# Patient Record
Sex: Male | Born: 1955 | Race: White | Hispanic: No | Smoking: Never smoker
Health system: Southern US, Community
[De-identification: ages and names within clinical notes are randomized; demographics above are authoritative.]

## PROBLEM LIST (undated history)

## (undated) DIAGNOSIS — I2510 Atherosclerotic heart disease of native coronary artery without angina pectoris: Secondary | ICD-10-CM

## (undated) DIAGNOSIS — I739 Peripheral vascular disease, unspecified: Secondary | ICD-10-CM

## (undated) DIAGNOSIS — Z8673 Personal history of transient ischemic attack (TIA), and cerebral infarction without residual deficits: Secondary | ICD-10-CM

## (undated) DIAGNOSIS — I10 Essential (primary) hypertension: Secondary | ICD-10-CM

## (undated) DIAGNOSIS — F32A Depression, unspecified: Secondary | ICD-10-CM

## (undated) DIAGNOSIS — F329 Major depressive disorder, single episode, unspecified: Secondary | ICD-10-CM

## (undated) DIAGNOSIS — G4733 Obstructive sleep apnea (adult) (pediatric): Secondary | ICD-10-CM

## (undated) DIAGNOSIS — I779 Disorder of arteries and arterioles, unspecified: Secondary | ICD-10-CM

## (undated) DIAGNOSIS — E782 Mixed hyperlipidemia: Secondary | ICD-10-CM

## (undated) HISTORY — PX: CORONARY ARTERY BYPASS GRAFT: SHX141

## (undated) HISTORY — DX: Essential (primary) hypertension: I10

## (undated) HISTORY — DX: Major depressive disorder, single episode, unspecified: F32.9

## (undated) HISTORY — DX: Depression, unspecified: F32.A

## (undated) HISTORY — DX: Peripheral vascular disease, unspecified: I73.9

## (undated) HISTORY — DX: Personal history of transient ischemic attack (TIA), and cerebral infarction without residual deficits: Z86.73

## (undated) HISTORY — PX: OTHER SURGICAL HISTORY: SHX169

## (undated) HISTORY — DX: Obstructive sleep apnea (adult) (pediatric): G47.33

## (undated) HISTORY — DX: Atherosclerotic heart disease of native coronary artery without angina pectoris: I25.10

## (undated) HISTORY — DX: Disorder of arteries and arterioles, unspecified: I77.9

## (undated) HISTORY — PX: BACK SURGERY: SHX140

## (undated) HISTORY — DX: Mixed hyperlipidemia: E78.2

## (undated) HISTORY — PX: KNEE SURGERY: SHX244

---

## 1999-01-18 ENCOUNTER — Inpatient Hospital Stay (HOSPITAL_COMMUNITY): Admission: AD | Admit: 1999-01-18 | Discharge: 1999-01-30 | Payer: Self-pay | Admitting: Cardiovascular Disease

## 1999-01-24 ENCOUNTER — Encounter: Payer: Self-pay | Admitting: Cardiovascular Disease

## 1999-01-25 ENCOUNTER — Encounter: Payer: Self-pay | Admitting: Cardiovascular Disease

## 1999-01-25 ENCOUNTER — Encounter: Payer: Self-pay | Admitting: Cardiothoracic Surgery

## 1999-01-26 ENCOUNTER — Encounter: Payer: Self-pay | Admitting: Cardiothoracic Surgery

## 1999-01-27 ENCOUNTER — Encounter: Payer: Self-pay | Admitting: Cardiothoracic Surgery

## 1999-01-28 ENCOUNTER — Encounter: Payer: Self-pay | Admitting: Cardiothoracic Surgery

## 2004-06-30 ENCOUNTER — Inpatient Hospital Stay (HOSPITAL_COMMUNITY): Admission: AD | Admit: 2004-06-30 | Discharge: 2004-07-03 | Payer: Self-pay | Admitting: Cardiology

## 2006-05-17 IMAGING — CR DG CHEST 1V PORT
1 series · 1 of 1 positions shown · non-contrast
Comparison: none

CLINICAL DATA: Chest pain.  
 PORTABLE CHEST - 1 VIEW 07/01/04 AT 3833 HOURS:

[view not recorded]
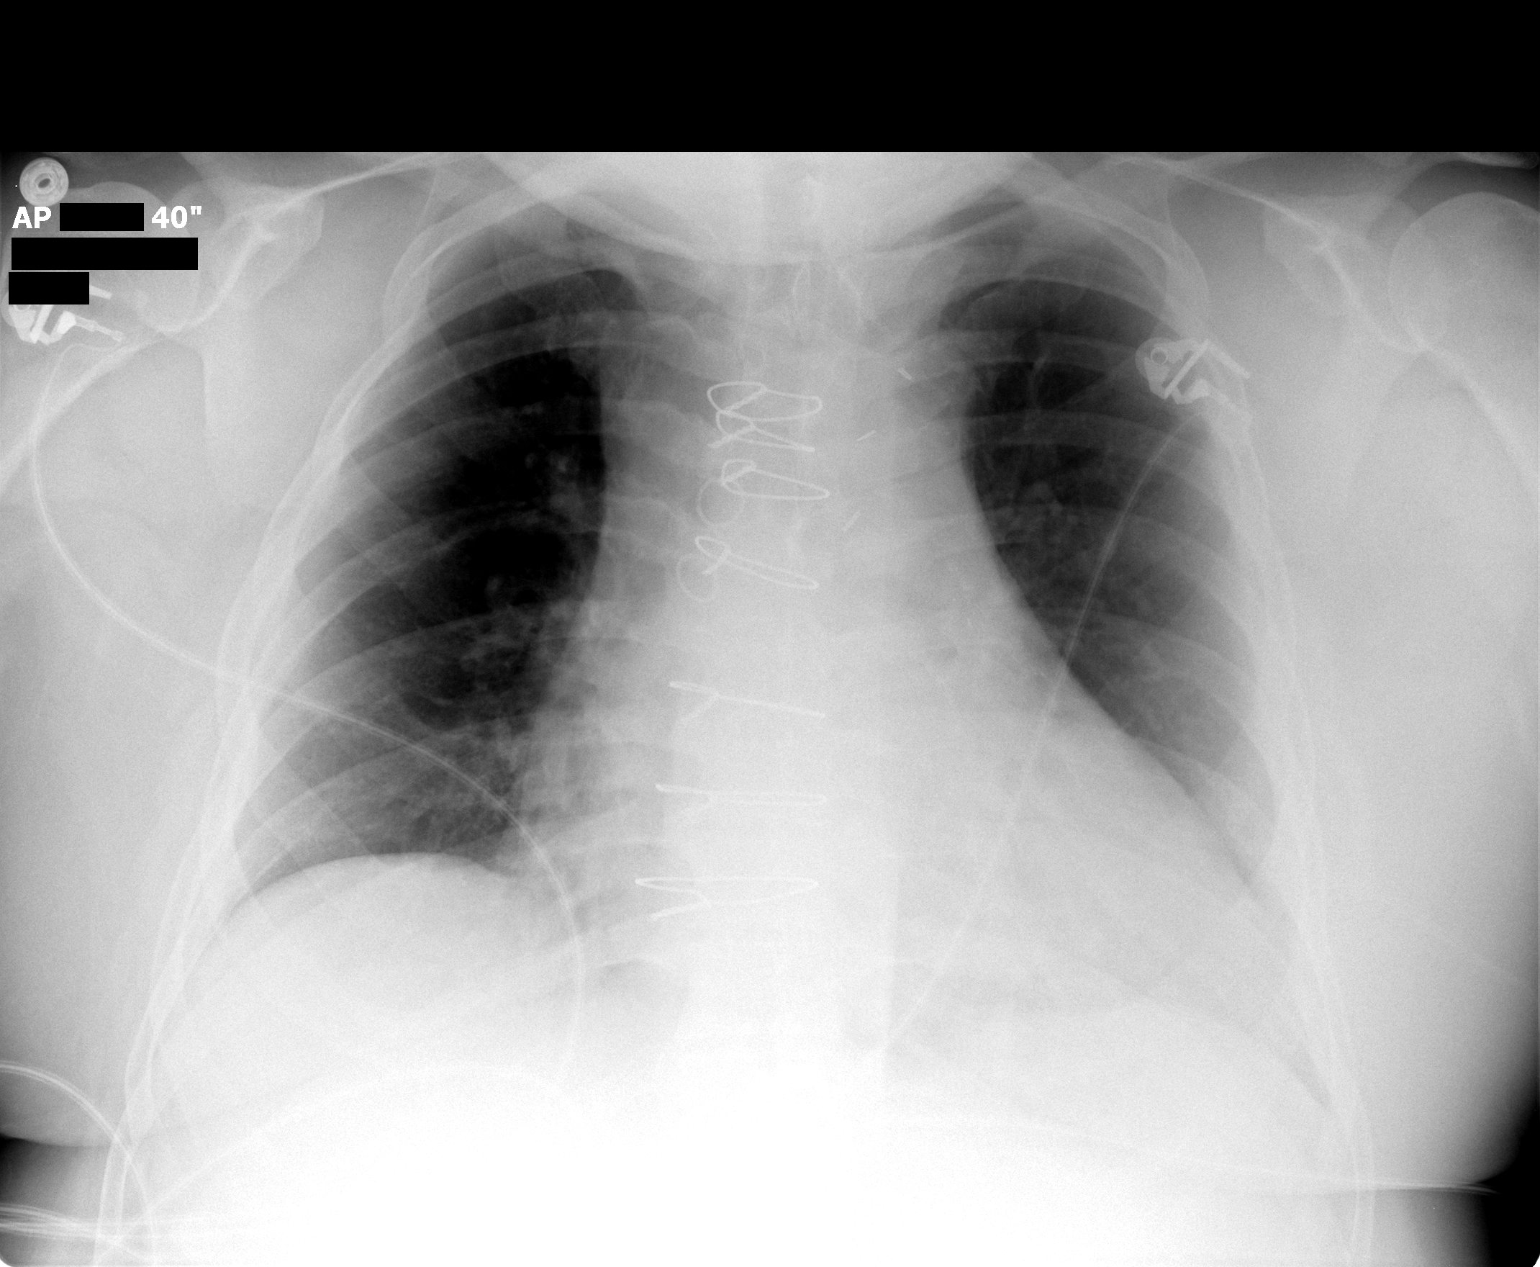

[1 of 1 positions shown; findings below may reference images not displayed]

FINDINGS: The heart is moderately enlarged.  The lungs are clear.  The pulmonary vasculature is within normal limits.  No pneumothoraces or effusions.
IMPRESSION: Cardiomegaly without CHF.

## 2006-06-27 ENCOUNTER — Ambulatory Visit: Payer: Self-pay | Admitting: *Deleted

## 2006-09-06 ENCOUNTER — Ambulatory Visit: Payer: Self-pay | Admitting: *Deleted

## 2006-09-06 ENCOUNTER — Inpatient Hospital Stay (HOSPITAL_COMMUNITY): Admission: RE | Admit: 2006-09-06 | Discharge: 2006-09-08 | Payer: Self-pay | Admitting: *Deleted

## 2006-09-06 ENCOUNTER — Encounter (INDEPENDENT_AMBULATORY_CARE_PROVIDER_SITE_OTHER): Payer: Self-pay | Admitting: *Deleted

## 2006-10-24 ENCOUNTER — Ambulatory Visit: Payer: Self-pay | Admitting: *Deleted

## 2008-07-22 IMAGING — CR DG CHEST 1V PORT
1 series · 1 of 1 positions shown · non-contrast
Comparison: 09/04/06.

CLINICAL DATA: 51 year old with chest pain and shortness of breath.  
 PORTABLE CHEST - 1 VIEW - 09/06/06:

[view not recorded]
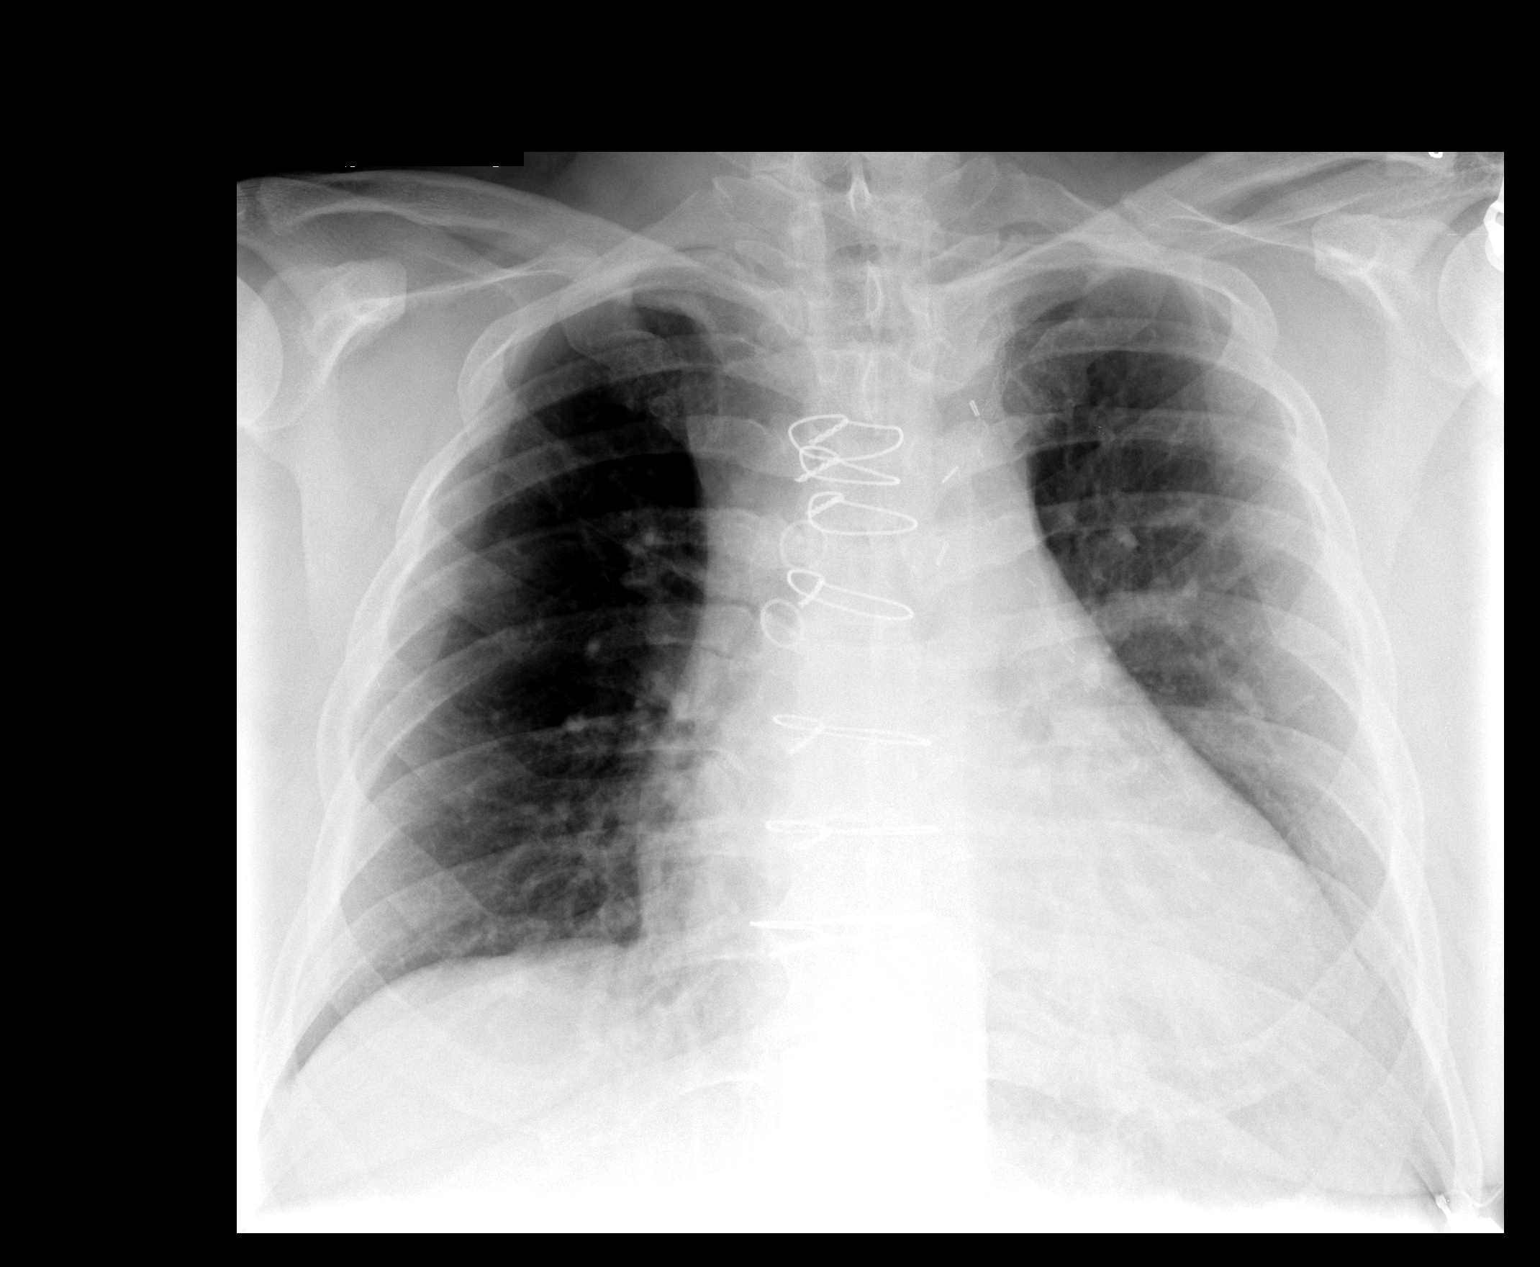

[1 of 1 positions shown; findings below may reference images not displayed]

FINDINGS: Stable surgical changes related to bypass surgery.  The heart is enlarged.  There are slightly low lung volumes with mild vascular crowding.  No edema, infiltrates, or effusions.  No pneumothorax.
IMPRESSION: Cardiac enlargement and chronic lung changes.  No definite acute pulmonary findings.

## 2010-05-23 NOTE — Discharge Summary (Signed)
[name], Wilkinson             ACCOUNT NO.:  [id number]   MEDICAL RECORD NO.:  [id number]          PATIENT TYPE:  INP   LOCATION:  2040                         FACILITY:  [facility]   PHYSICIAN:  [name], M.D.    DATE OF BIRTH:  1955-03-18   DATE OF ADMISSION:  09/06/2006  DATE OF DISCHARGE:  09/08/2006                               DISCHARGE SUMMARY   Please see discharge summary job [phone] for details regarding patient's  admission diagnoses, hospital course, discharge medications and follow  up.   ADMISSION DIAGNOSIS:  Right leg claudication.   DISCHARGE AND SECONDARY DIAGNOSES:  1. Right lower extremity claudication, status post right femoral      endarterectomy.  2. Postoperative unstable angina with workup negative for myocardial      infarction, now improved with a chronic level 4 angina.  3. History of coronary artery disease, status post previous coronary      artery bypass grafting in 2001.  4. Hyperlipidemia.  5. Extracranial cerebrovascular occlusive disease, status post left      carotid endarterectomy, followed by left carotid stenting.  6. Severe sleep apnea, use of CPAP.   ALLERGIES:  No drug allergies.   HISTORY OF PRESENT ILLNESS:  Alexander Wilkinson is a 55 year old Caucasian  male with a history of known atherosclerotic vascular disease.  He had  previously undergone redo coronary artery bypass grafting by Dr.  Tyrone Sage in 2001.  He had also had left carotid endarterectomy with  subsequent with early re-stenosis and had a left carotid stent placed in  December 2005 at [facility].  In February this year, he  suffered an episode of syncope and was found to have a reduced ejection  fraction and a LIMA placed.  Since the catheterization procedure, he has  suffered from right lower extremity claudication.  He notes pain in his  right calf with ambulation, which is relieved readily by rest.  He had a  CT angiogram carried out at [facility] and  revealed a focal  dissection at the level of the right common femoral artery.  Lower  extremity Doppler evaluation revealed an ankle brachial index of 1.14 on  the right.  However, drop in pressure in his right lower extremity with  exercise associated with onset of claudication.  However, there is a  drop in pressure in his right lower extremity with exercise associated  with onset of claudication in his right calf.  He was referred to  vascular surgery, Dr. Denman George, who recommended right femoral  artery repair of apparent regional dissection, due to dissection of his  right lower extremity claudication.   HOSPITAL COURSE:  Please see previously dictated discharge summary.  He  as well, however, following this discharge summary dictation, developed  severe chest pain in the recovery unit, which he rated 9/10.  A  cardiology consult was requested and he was seen by [facility] and  Vascular cardiologist, Dr. Jenne Campus.  He was started on nitroglycerin  drip and given a bolus of Plavix 300 mg.  He underwent serial cardiac  enzymes, which were in fact  negative.  He remained on nitroglycerin drip  overnight and the morning round of postoperative day 1, was still having  some chest pain but now at a level of 2/10, which he said 2-3/10 is his  baseline at home.  Subsequently, the nitroglycerin drip was  discontinued.  However, it was felt he should be monitored at least  another night to insure that his chest pain did not intensify,  particularly with activity.  On postoperative day 2, he remained  hemodynamically stable, still with chest pain about 2-3/10, which again  was his baseline.  Other than his initial first time of ambulation, he  had no change in intensity in his chest pain with activity.  Vitals  showed a blood pressure of 109/73.  Heart rate in sinus rhythm promptly  relieved at 60. He was afebrile with one isolated temperature of 99.9  overnight.  Oxygen saturation  was 97% on room air.  He was voiding  without difficulty.  Pain was controlled with oral medication.  His  wound appeared to be healing well without any sign of infection or  hematoma.  He had palpable pedal pulses bilaterally.  Most recent labs  showed a hemoglobin of 11.4, hematocrit 33.2, platelet count of 185.  White blood count was 8.3.  Sodium 136, potassium 4.1, chloride 101, CO2  31, BUN 10, creatinine 1.12, blood glucose 128.  His liver function  tests and cardiac enzymes were normal.  His TSH was also normal at  2.545.  He was ambulating without difficulty as well.   On postoperative day 2, he was reevaluated by cardiology.  They felt he  was stable from a cardiac standpoint for discharge.  He was also felt to  have met criteria for discharge from a vascular standpoint as well.  He  was discharged on evening of postoperative day 2 in stable condition.   DISCHARGE INSTRUCTIONS:  Discharge medications are as previously  dictated.  Discharge instructions are as dictated well, except he was  instructed to follow up with his primary cardiologist, Dr. [name], in 2  weeks or sooner if needed.      [name], P.A.      [name], M.D.  Electronically Signed    AWZ/MEDQ  D:  09/08/2006  T:  09/08/2006  Job:  [id number]   cc:   [name], M.D.  [facility]. [name], M.D.

## 2010-05-23 NOTE — Discharge Summary (Signed)
NAMETAM, DELISLE             ACCOUNT NO.:  [id number]   MEDICAL RECORD NO.:  [id number]          PATIENT TYPE:  INP   LOCATION:  3311                         FACILITY:  [facility]   PHYSICIAN:  [name], M.D.    DATE OF BIRTH:  Jan 14, 1955   DATE OF ADMISSION:  09/06/2006  DATE OF DISCHARGE:  09/07/2006                               DISCHARGE SUMMARY   ADMISSION DIAGNOSES:  1. Claudication, right lower extremity.  2. Coronary artery disease, status post redo coronary artery bypass in      2001.  3. Hyperlipidemia.  4. Extracranial cerebrovascular occlusive disease, status post left      carotid endarterectomy followed by left carotid stenting.   HOSPITAL COURSE:  [name] was admitted to [facility] the morning of September 06, 2006, and taken to the operating room  electively for a right femoral endarterectomy with bovine patch  angioplasty.  This was uneventful procedure.  At termination of the  procedure, he had a 2+ right dorsalis pedis pulse.   The patient was transferred to the Intermediate Care Unit for  observation overnight.  Plan for discharge from hospital on postop day  #1, September 07, 2006, barring complications.   Preoperative assessment revealed chest x-ray with postoperative changes,  heart size was stable.  Lungs were clear and no pleural fluid.  Blood  work revealed no major abnormality.  Random blood sugar was mildly  elevated at 111.  Urinalysis was unremarkable.  Hemoglobin was 13.6,  hematocrit 40.7 with WBC 5.7 and platelet count 227.   DISCHARGE MEDICATIONS:  1. Atenolol 12.5 mg p.o. b.i.d.  2. Plavix 75 mg p.o. daily.  3. Aspirin 81 mg p.o. daily.  4. Imdur 60 mg p.o. daily.  5. Nitroglycerin patch 0.6 once daily.  6. Vytorin 10/80 nightly.  7. Niaspan 1500 mg nightly.  8. Amitriptyline 10 mg nightly.  9. Celexa 10 mg daily.  10.Vitamin B12 1 tablet daily.  11.Vitamin E 1 tablet daily.  12.Omega-3 fish oil 1 tablet daily.  13.Lortab 5/325 1-2 tablets q.4-6 h. p.r.n. as needed.   FOLLOW UP:  The patient will be contacted for office visit in  approximately 1 month.   DISCHARGE DIAGNOSIS:  Right lower extremity claudication.      [name], M.D.  Electronically Signed     PGH/MEDQ  D:  09/06/2006  T:  09/07/2006  Job:  [id number]   cc:   Macarthur Critchley. [name], M.D.

## 2010-05-23 NOTE — Assessment & Plan Note (Signed)
OFFICE VISIT   Alexander Wilkinson, Alexander Wilkinson  DOB:  Jan 12, 1955                                       10/24/2006  NWGNF#:[id number]   The patient is status post right femoral endarterectomy with bovine  patch angioplasty carried out 09/06/2006 at [facility].  Has  had significant improvement in his right lower extremity claudication  symptoms.   Right groin incision healing unremarkably.  2+ right dorsalis pedis  pulse present.  No significant ankle edema.   BP 111/70, pulse 68, respirations 18, O2 saturation 100%.   The patient has done extremely well.  Will enter him in protocol for his  peripheral vascular disease.  He has also had a left carotid  endarterectomy and left carotid stent placed at [facility].  The left carotid  stent was placed in December of 2005.   We will plan to schedule the patient for a carotid surveillance,  especially since he does have a stent in place.  This will be done on a  29-monthly basis.   [name], M.D.  Electronically Signed   PGH/MEDQ  D:  10/24/2006  T:  10/26/2006  Job:  [id number]   cc:   Macarthur Critchley. [name], M.D.

## 2010-05-23 NOTE — H&P (Signed)
Alexander Wilkinson, Alexander Wilkinson             ACCOUNT NO.:  [id number]   MEDICAL RECORD NO.:  [id number]          PATIENT TYPE:  INP   LOCATION:  2899                         FACILITY:  [facility]   PHYSICIAN:  [name], M.D.    DATE OF BIRTH:  11-18-1955   DATE OF ADMISSION:  09/06/2006  DATE OF DISCHARGE:                              HISTORY & PHYSICAL   CARDIOLOGIST:  Macarthur Critchley. [name], M.D.   ADMISSION DIAGNOSIS:  Right lower extremity claudication.   HISTORY:  [name] is a 55 year old gentleman with a history of  known atherosclerotic vascular disease.  He has previously undergone  redo coronary bypass carried out by Dr. Tyrone Sage in 2001.  He has also  had a left carotid endarterectomy with subsequent early restenosis and  had a left carotid stent placed December 2005 at [facility].   He suffered an episode of syncope in February of this year, was found to  have a reduced ejection fraction and a LIMA stent was placed.  Since the  catheterization procedure he has suffered from right lower extremity  claudication.  He notes pain in his right calf with ambulation.  This is  relieved readily by rest.  His CT angiogram carried out in [place]  reveals a focal dissection at the level of the right common femoral  artery.  Lower extremity Doppler evaluation reveals ankle-brachial index  of 1.14 on the right.  There is, however, a drop in pressure in his  right lower extremity with exercise associated with the onset of  claudication in his right calf.   PAST MEDICAL HISTORY:  1. Coronary artery disease status post redo coronary artery bypass      2001.  2. Hyperlipidemia.  3. Extracranial cerebrovascular occlusive disease status post left      carotid endarterectomy followed by left carotid stenting.   MEDICATIONS:  1. Atenolol 12.5 mg p.o. b.i.d.  2. Plavix 75 mg daily.  3. Aspirin 81 mg daily.  4. Imdur 60 mg daily.  5. Nitroglycerin patch 0.6 daily.  6. Vytorin  10/80 one q.h.s.  7. Niaspan 1500 mg one q.h.s.  8. Amitriptyline 10 mg q.h.s.  9. Celexa 10 mg daily.  10.Vitamin B12 one tablet daily.  11.Vitamin E one tablet daily.  12.Omega-3 fatty acid one tablet daily.   ALLERGIES:  None known.   SOCIAL HISTORY:  The patient is married with four children.  He is on  long-term disability.  He does not currently use tobacco or alcohol  products.   REVIEW OF SYSTEMS:  He does suffer from occasional recurrent chronic  chest pain.  He has sleep apnea.  A history of umbilical hernia.  Occasional dizziness.  No recent syncopal episodes.  Describes long-term  depression.  Chronic anemia.   PHYSICAL EXAMINATION:  GENERAL:  Well-appearing 55 year old male.  Alert  and oriented.  No acute distress.  VITAL SIGNS:  BP is 120/70, pulse is 60 per minute, respirations are 18  per minute.  NECK:  Supple without thyromegaly or adenopathy.  CHEST:  Equal air entry bilaterally.  No rales or rhonchi.  CARDIOVASCULAR:  Normal heart sounds without murmurs.  No carotid  bruits.  No femoral bruits.  ABDOMEN:  Soft and nontender.  No masses, organomegaly.  Normal bowel  sounds.  LOWER EXTREMITIES:  No femoral bruits, 1+ right femoral and 2+ left  femoral pulse.  Right popliteal, posterior tibial and dorsalis pedis  pulses are 1+.  Left leg reveals 2+ popliteal, posterior tibial and  dorsalis pedis pulses.  NEUROLOGIC:  The patient is alert and oriented.  Moves all extremities  to command.  Cranial nerves are intact.   IMPRESSION:  1. 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  2. Coronary artery disease.  3. Extracranial cerebrovascular occlusive disease.  4. Hyperlipidemia.   RECOMMENDATION:  The patient admitted to the hospital electively for  right common femoral artery repair due to dissection and symptomatic  right lower extremity claudication.      [name], M.D.  Electronically  Signed     PGH/MEDQ  D:  09/06/2006  T:  09/07/2006  Job:  [id number]   cc:   Macarthur Critchley. [name], M.D.

## 2010-05-23 NOTE — Consult Note (Signed)
VASCULAR SURGERY CONSULTATION   HAGAN, VANAUKEN  DOB:  1955-06-14                                       06/27/2006  BJYNW#:[id number]   REFERRING PHYSICIAN:  Macarthur Critchley. [name], M.D.   REASON FOR CONSULTATION:  Right lower extremity claudication.   HISTORY:  Mr. Selner is a 55 year old gentleman, who has a history of  redo coronary artery bypass carried out by Dr. Tyrone Sage in 2001.  He  also has undergone left carotid endarterectomy, developed early  restenosis and had left carotid stent placed in December 2005 at [facility].   He suffered an episode of syncope in February of this year, was found to  have a reduced ejection fraction and had a LIMA stent placed.  Since  that catheterization procedure, he has suffered from right lower  extremity claudication.  He suffers from pain in his right calf with  ambulation.  This is relieved readily by rest.   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   Lower extremity Doppler evaluation revealed ankle brachial index 1.14 on  the right.  No exercise testing was carried out at that time.   PAST MEDICAL HISTORY:  1. Coronary artery disease status post redo coronary artery bypass in      2001.  2. Hyperlipidemia.  3. Extracranial cerebrovascular occlusive disease status post left      carotid endarterectomy followed by left carotid stenting.   MEDICATIONS:  1. Atenolol 12.5 mg b.i.d.  2. Plavix 75 mg daily.  3. Aspirin 81 mg daily.  4. Imdur 1 tab daily.  5. Nitro-Dur patch 0.6 once daily.  6. Vytorin 40 mg daily.  7. Niaspan 1500 mg daily.  8. Amitriptyline 10 mg q.h.s.  9. Celexa 10 mg daily.  10.Vitamin B12, omega 3 fatty acid and vitamin E.   ALLERGIES:  None known.   SOCIAL HISTORY:  The patient is married with 4 children.  He is on  disability..  He does not currently use tobacco or alcohol  products.   REVIEW OF SYSTEMS:  He does suffer from recurrent chronic chest pain.  Has sleep apnea.  History of umbilical hernia.  Occasional dizziness.  No recent syncopal episodes.  Describes long-term depression.  Chronic  anemia.   PHYSICAL EXAMINATION:  General:  Generally well-appearing 55 year old  male.  Alert and oriented x3.  No acute distress.  Vital signs:  BP  115/71, pulse 55 per minute, respirations 15 per minute.  Chest:  Equal  air entry bilaterally without rales or rhonchi.  Cardiovascular:  Normal  heart sounds without murmurs.  No carotid bruits.  No femoral bruits.  Abdomen:  Soft, nontender.  No masses or organomegaly.  Normal bowel  sounds.  Lower extremities:  No femoral bruits.  1+ right femoral 2+  left femoral pulse.  1+ right popliteal posterior tibial and dorsalis  pedis pulses.  Left leg reveals intact popliteal, posterior tibial, and  dorsalis pedal pulses.   INVESTIGATIONS:  Exercise testing reveals significant drop in right  lower extremity pressure at 1 minute.   IMPRESSION:  1. 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  2.  Coronary artery disease.  3. Extracranial cerebrovascular occlusive disease.  4. Hyperlipidemia.   RECOMMENDATIONS:  We will obtain a copy of the CT angiogram carried out  at [facility].  If this is an inadequate study, we will plan  repair of right common femoral artery dissection for relief of lower  extremity claudication.   [name], M.D.  Electronically Signed  PGH/MEDQ  D:  06/27/2006  T:  06/28/2006  Job:  [id number]

## 2010-05-23 NOTE — Op Note (Signed)
[name], [name]             ACCOUNT NO.:  [id number]   MEDICAL RECORD NO.:  [id number]          PATIENT TYPE:  INP   LOCATION:  2899                         FACILITY:  [facility]   PHYSICIAN:  [name], M.D.    DATE OF BIRTH:  1955/09/24   DATE OF PROCEDURE:  09/06/2006  DATE OF DISCHARGE:                               OPERATIVE REPORT   SURGEON:  Alexander George, MD.   ASSISTANT:  Nurse.   ANESTHETIC:  General endotracheal.   PREOPERATIVE DIAGNOSIS:  Right lower extremity claudication.   POSTOPERATIVE DIAGNOSIS:  Right lower extremity claudication.   PROCEDURE:  Right femoral endarterectomy with bovine patch angioplasty.   CLINICAL NOTE:  Alexander Wilkinson is a 55 year old patient with known  atherosclerotic vascular disease.  He has a history of previous redo  coronary artery bypass, left carotid endarterectomy and subsequent  carotid stenting.  He has undergone catheterization multiple times via  the right femoral artery.  He now has right lower extremity  claudication.  Workup for this revealed normal pressures in his right  leg at rest, significant drop with exercise.  Onset of claudication  symptoms with exercise and drop in pressure in his right leg.  CT  angiogram revealed a dissection in his right common femoral artery.  He  is brought to the operating room at this time for repair as right common  femoral artery.   DESCRIPTION OF PROCEDURE:  The patient brought the operating room in  stable hemodynamic condition.  Placed under general endotracheal  anesthesia.  The right leg prepped and draped in sterile fashion.   Longitudinal skin incision made through the right groin.  Dissection  carried to subcutaneous tissue and lymphatics with electrocautery.  Bridging veins were ligated with 2-0 silk and divided.  The right common  femoral artery mobilized up to the inguinal ligament.  The inguinal  ligament freed up and the distal external iliac artery was  encircled  with a vessel loop.  There was extensive scar along the right common  femoral artery.  There were three distinct areas of previous  catheterization where a closure device had been used.  The right common  femoral artery was thickened.  Distal dissection carried down to the  origin of the profunda and superficial femoral arteries which were each  encircled with a vessel loop.  Tributaries of the common femoral artery  encircled with fine vessel loops.  The patient administered 5000 units  of heparin intravenously.   The external iliac artery was controlled with a Henley clamp.  The  profunda and superficial femoral arteries controlled bulldog clamps.  Longitudinal arteriotomy made through the right common femoral artery.  Right common femoral artery was quite thickened.  There was extensive  pseudointimal buildup and dissection present.  An endarterectomy of the  right common femoral artery carried from the external iliac artery down  to the origin of the profunda and superficial femoral arteries.  A patch  angioplasty then carried out the common femoral artery using a bovine  pericardial patch with running 6-0 Prolene suture.  At completion of the  patch  angioplasty, the vessels well flushed.  Clamps were then removed.  Excellent flow present with a 2+ right dorsalis pedis pulse.   The wound irrigated with saline solution.  Subcutaneous tissue then  closed with a deep layer of running 2-0 Vicryl suture.  Superficial  layer with running 3-0 Vicryl suture.  Skin closed with 4-0 Monocryl.  Dermabond applied.  The patient tolerated the procedure well.  Transferred to the recovery room in stable condition.  No apparent  complications.      [name], M.D.  Electronically Signed     PGH/MEDQ  D:  09/06/2006  T:  09/07/2006  Job:  [id number]   cc:   Macarthur Critchley. [name], M.D.

## 2010-05-26 NOTE — Discharge Summary (Signed)
Alexander Wilkinson, Alexander Wilkinson             ACCOUNT NO.:  [id number]   MEDICAL RECORD NO.:  [id number]          PATIENT TYPE:  INP   LOCATION:  2872                         FACILITY:  [facility]   PHYSICIAN:  Cristy Hilts. Jacinto Halim, MD       DATE OF BIRTH:  07-Oct-1955   DATE OF ADMISSION:  06/30/2004  DATE OF DISCHARGE:  07/03/2004                                 DISCHARGE SUMMARY   DISCHARGE DIAGNOSES:  1.  Unstable angina pectoris.  2.  Known coronary artery disease, status post two open heart surgeries in      2000 after graft failure in 2001.  3.  Known peripheral vascular disease, status post carotid endarterectomy      and postsurgical restenosis treated with stent insertion.  4.  Obstructive sleep apnea on BiPAP machine.  5.  Hypertension.  6.  Transient ischemic attack.  7.  Depression/anxiety.  8.  Hyperlipidemia.  9.  Morbid obesity.   HISTORY OF PRESENT ILLNESS:  This is a 55 year old Caucasian gentleman of  Dr. Macarthur Critchley. Torelli with prior history of coronary artery disease who  underwent coronary artery bypass grafting in 2000 complicated by early  failure of the graft and had to redo CABG x4 in 2001 by Dr. Sheliah Plane.  He also had carotid endarterectomy and TIA prior to that and six months  after the surgery in 2001 he was diagnosed with restenosis of the site of  endarterectomy and underwent stenting of this stenotic segment at [facility]. He was in his usual state of health up until the  day of his presentation when he woke up in the morning having chest pain. As  the day progressed, the pain got worse and he took three nitroglycerin with  relief of pain and then went to [facility].   In [place], he was started on Integrilin, Lovenox, and IV nitroglycerin  and transported to [facility] Emergency Room for further  cardiac evaluation and admission. Of note, the patient experienced this  daily these episodes of angina.   The patient  was admitted to CCU and we continued his IV Integrilin and  switched to Lovenox to heparin and kept him on nitroglycerin. He was  admitted on Friday and stayed on observation over the weekend. Hospital  procedures are cardiac catheterization performed by Dr. Darlin Priestly on  July 03, 2004. This angiography showed EF of 50% and all grafts were patent  but he had severe native coronary disease. He does have a totally occluded  LAD with a patent graft LIMA to the LAD. He has occluded diagonal with SVG  to the diagonal and sequential ramus. He does have a native circumflex  significant disease with stenosis of the 90% in the proximal to midportion.  His RCA is still occluded but patent. He has a patent SVG to the RCA and  sequentially obtuse marginal branch.   The patient tolerated the procedure well. He did not develop any  complications. He was observed on the short-stay unit postcatheterization  while on bed rest and discharged home after ambulation in  stable condition.   LABORATORY DATA:  Hemoglobin 11.2, hematocrit 33, white blood cell count  7.7, platelets 244,000. Sodium 139, potassium 3.9. BUN 13, creatinine 1.1,  chloride 106, CO2 30, glucose 135. LDL was 71, HDL 27, Tricor 261, total  cholesterol 150. His CK-MB were negative x3.   DISCHARGE MEDICATIONS:  1.  Atenolol 12.5 mg b.i.d.  2.  Tricor 145 mg daily.  3.  Zocor 80 mg daily.  4.  Niaspan 2000 mg daily.  5.  Celexa 10 mg daily.  6.  Aspirin 81 mg daily.  7.  Plavix 75 mg daily.  8.  Imdur 30 mg daily.  9.  Nitroglycerin 0.4 mg p.r.n. for pain.   DISCHARGE INSTRUCTIONS:  No driving and no lifting greater than 5 pounds. No  strenuous activity three days postcatheterization. Continue with low  cholesterol, low fat, and low carbohydrate diet. The patient was allowed to  shower and asked to report any problems with wound puncture site in our  office and a number was provided. He was instructed to make an appointment   to follow up with Dr. Macarthur Critchley. Torelli as an outpatient in two to three  weeks.       MK/MEDQ  D:  07/03/2004  T:  07/04/2004  Job:  [id number]   cc:   Macarthur Critchley. [name], M.D.  [address] [place]  [address]  [place]  [place] [address]  Fax: [phone]   [facility]

## 2010-05-26 NOTE — Cardiovascular Report (Signed)
NAMEFRISCO, CORDTS             ACCOUNT NO.:  [id number]   MEDICAL RECORD NO.:  [id number]          PATIENT TYPE:  INP   LOCATION:  2922                         FACILITY:  [facility]   PHYSICIAN:  Darlin Priestly, MD  DATE OF BIRTH:  Aug 22, 1955   DATE OF PROCEDURE:  07/03/2004  DATE OF DISCHARGE:                              CARDIAC CATHETERIZATION   PROCEDURE:  1.  Left heart catheterization.  2.  Coronary angiography.  3.  Left ventriculogram.  4.  Saphenous vein graft.  5.  Left internal mammary angiography.  6.  Left subclavian angiography.   CARDIOLOGIST:  Darlin Priestly, M.D.   COMPLICATIONS:  None.   INDICATIONS FOR PROCEDURE:  [name] is a 55 year old male, a patient  of Dr. Macarthur Critchley. Torelli and Dr. Marlowe Alt, with a history of hyperlipidemia, a  history of coronary artery disease, status post coronary artery bypass graft  surgery in 2000, consisting of a LIMA to the LAD with a left radial graft  from the LIMA to the diagonal.  The patient underwent a repeat  catheterization in 2001, with significant progression of disease in the OM  and RCA system.  He underwent a repeat bypass surgery consisting of a  saphenous vein graft to the diagonal and ramus sequentially and saphenous  vein graft to the distal RCA as well as OM.  He was re-catheterized in 2002,  at [facility] where he was also noted to have a restenosis of his left CEA, and  underwent a carotid stent at [facility].  He presented to the emergency room on  June 30, 2004, in [place] and complaining of substernal chest pain  which had markedly increased.  He is now transferred for a cardiac  catheterization, to rule out significant depression of his grafts.   DESCRIPTION OF PROCEDURE:  After giving an informed consent, the patient was  brought to the cardiac catheterization laboratory where the right and left  groins were shaved, prepped and draped in the usual sterile fashion.  An  electrocardiogram monitor was  established.  Using a modified Seldinger  technique, a 6-French diagnostic sheath was inserted in the right femoral  artery.  The 6-French diagnostic catheters were then used to perform a  diagnostic angiography.   RESULTS:  1.  LEFT MAIN CORONARY ARTERY:  The left main coronary artery is a medium-      sized vessel which is calcified and has diffuse 70% stenosis.  2.  LEFT ANTERIOR DESCENDING CORONARY ARTERY:  The left anterior descending      coronary artery is a medium-sized vessel in its proximal portion and      fills competitively in its mid and distal portion.  There is a patent      LIMA which inserts on the mid-portion of the LAD which appears to be      patent with no significant disease in the IMA or distal IMA insertion.      There is a patent sequential vein graft to a small diagonal and medium-      sized ramus.  The vein graft does not appear to have  any significant      lesion.  The diagonal is a small vessel which bifurcates beyond the      graft and has no significant disease.  The ramus is a medium-sized      vessel which bifurcates distally with no significant disease.  3.  LEFT CIRCUMFLEX CORONARY ARTERY:  The left circumflex coronary artery is      a medium-sized which courses in the AV groove.  There is one small      obtuse marginal branch.  The AV groove circumflex is noted to be      diffusely diseased with a 70% proximal, a 90% mid and a 90% after the      takeoff of a first small OM.  There is a patent sequential vein graft to      the distal PDA, as well as lower obtuse marginal.  This graft appears to      be widely patent with no significant disease.  The PDA beyond the graft      insertion is a small vessel but has no high-grade stenosis.  The OM      beyond the graft insertion has diffuse 50% disease which appears to be      non-critical with TIMI-3 flow into the OM.  4.  RIGHT CORONARY ARTERY:  The right coronary artery is noted to be totally       occluded in its proximal portion.   LEFT VENTRICULOGRAM:  The left ventriculogram reveals a preserved ejection  fraction of 50%.   LEFT SUBCLAVIAN ANGIOGRAM:  The left subclavian angiogram reveals a widely  patent left subclavian.   HEMODYNAMIC:  Arterial pressure:  115/66.  LV pressure:  112/6.  LVEDP :  18.   CONCLUSION:  1.  Significant left main and two-vessel coronary artery disease.  2.  Patent left internal mammary artery to the left anterior descending      coronary artery.  3.  Patent sequential saphenous vein graft to the diagonal and ramus, with      no disease beyond the graft insertion.  4.  Patent sequential vein graft to the posterior descending artery and      obtuse marginal, with a 50% lesion beyond the vein graft in the obtuse      marginal.  5.  Normal left ventricular systolic function.  6.  Widely patent left subclavian.  7.  Elevated left ventricular end diastolic pressure.       RHM/MEDQ  D:  07/03/2004  T:  07/03/2004  Job:  [id number]   cc:   Macarthur Critchley. [name], M.D.  [address] [place]  [address]  [place]  [place] [address]  Fax: [phone]   Dr. Marlowe Alt

## 2010-05-26 NOTE — Cardiovascular Report (Signed)
[place]. [facility]  Patient:    Alexander Wilkinson               MRN: [id number] Proc. Date: 01/19/99 Adm. Date:  [id number] Attending:  Berry, Jonathan [place] CC:         Cardiac Catheterization Laboratory             Lenise Herald, M.D. - Dr. Jeraldine Loots, [place], [place]             Brien Mates, M.D. & Dr. Burna Mortimer -  c/o [address] [place], [place] [address]                        Cardiac Catheterization  PROCEDURE:   Retrograde central aortic catheterization, selective coronary angiography, pre-intercoronary and post-intercoronary nitroglycerin administration, selective left internal mammary artery injection, abdominal aortic aneurysm midstream PA projection, multiple.  CARDIOLOGIST:  Richard A. Alanda Amass, M.D.  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  He has premature coronary artery disease, a history of hypertension, and hyperlipidemia. He underwent multivessel coronary artery bypass grafting by Dr. Jeraldine Loots at [facility] in September 2000.  His operative note is not available, despite multiple attempts; however, available records indicate an LIMA to Alexander LAD, and a composite radial artery graft from Alexander LIMA to Alexander diagonal and obtuse marginal branch sequentially.  Alexander patient had recurrent angina with prolonged chest pain, without a myocardial infarction, compatible with acute coronary syndrome, non-ST elevation, and was studied at [facility] by Dr. Brien Mates on January 17, 1999.  Alexander LIMA native coronary angiography and LV angiogram in Alexander RAO projection were performed.  It was felt that Alexander patient might possibly be a candidate for a percutaneous intervention, and he was referred to [place] for further evaluation.  On review of Alexander patients digital images with myself and with Dr.  Lenise Herald, I felt that Alexander anatomy showed moderate stenosis beyond Alexander LIMA insertion that was probably not Alexander culprit lesion.  Alexander patient had 50% main left,  high-grade proximal dominant circumflex, native disease that was unprotected, and severe disease beyond Alexander sequential radial composite graft insertion.  Because of overlap and technical difficulty, it was felt best to proceed with reangiography to try to assess Alexander patients anatomy better.  It was explained to Alexander patient, and he agreed to proceed with this.  DESCRIPTION OF PROCEDURE:  Apparent VasaSeal collagen device was used in Alexander right groin, which looked quite good, but we elected to proceed with a left groin procedure.  Alexander LFA was entered with a single anterior puncture using a #18 thin-walled needle and a 6-French short Terumo side-arm sheath was inserted without difficulty.  A diagnostic coronary angiography was done with Alexander 6-French 4.0 cm taper left and right coronary catheters pre and post-IC nitroglycerin administration.  Multiple projections were performed to try to separate his proximal native vessels.  Selective LIMA was done with a 6-French LIMA catheter in multiple projections as well, to better visualize Alexander graft and composite radial artery anastomoses.  LV angiogram was not performed, which had previously been done, with no intervening cardiac event.  An abdominal aortic angiogram was done, since Alexander patient had systemic hypertension, and coronary disease.  This was done in Alexander mid-stream PA projection above Alexander level of Alexander renal  arteries at 30 cc, 20 cc per second through a 6-French pigtail catheter.  Alexander catheter was removed.  Alexander side-arm sheath was flushed. Alexander Wilkinson was discontinued on entering Alexander laboratory.  He was  off of heparin.  Alexander final ACT was 180, and he was taken to Alexander holding area where Alexander sheath removal and pressure hemostasis was performed,  without difficulty. During Alexander procedure he had 2 mg of Versed and 2 mg of Nubain for sedation, and 1% Xylocaine was used for local anesthesia.  Hexabrix dye was used throughout Alexander procedure.  He tolerated Alexander procedure well.  PRESSURES: CA:  170/85 mmHg. CA post-IC nitroglycerin:  139/71 mmHg. LV:  Was not entered.  Prior LV angiogram of January 17, 1999, showed mild hypokinesis in Alexander mid-anterolateral wall and very small area of paradoxical apical motion, and mild hypokinesis of Alexander mid-third of Alexander inferior wall with an ejection fraction of approximately 50%.  No mitral regurgitation.  ABDOMINAL AORTIC ANGIOGRAM:  In Alexander midstream PA projection revealed normal single renal arteries bilaterally.  Normal proximal celiac SMA access, and normal IMA.  Alexander proximal iliacs were normal, and Alexander hypogastrics were intact bilaterally.  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  FLUOROSCOPY:  Showed 2+ calcification of Alexander proximal LAD, main left, and proximal RCA.  There was no intracardiac or valvular calcification seen fluoroscopically.  NATIVE CORONARY ANGIOGRAPHY: 1. Main left coronary artery:  Showed diffuse 50% narrowing, somewhat eccentric    in Alexander proximal and midportion.  Alexander vessel did appear to be diffusely    diseased and there was catheter damping with Alexander 6-French catheter. 2. Left anterior descending coronary artery:  Was totally occluded after a    large second diagonal branch, although there was retrograde filling of    Alexander LIMA.  Alexander first diagonal branch was moderately large and had high-grade    80%-85% proximal stenosis, and was ungrafted.  Alexander second diagonal branch    was a large vessel and was a part of Alexander composite radial graft, but did    fill antegrade, with retrograde filling of Alexander radial graft and LIMA.    Beyond Alexander insertion of Alexander sequential radial graft there were 90% lesions    of  Alexander superior and inferior bifurcation branches, with large bifurcating    branches beyond this.  Alexander optional diagonal had 80%-70% ostial stenosis.    It was a moderate-sized vessel and bifurcated.  Alexander superior bifurcation     branch was totally occluded at Alexander insertion of Alexander distal sequential limb    of Alexander composite radial graft.  Alexander inferior bifurcation branch was patent,    and of small to moderate size. 3. Native circumflex coronary artery:  Had 50% concentric narrowing from its    ostium in Alexander proximal third, 85%-90% focal eccentric stenosis beyond this,    and another 85%-90% high-grade stenosis involving Alexander bifurcation of a    marginal and distal circumflex branch.  Alexander circumflex was dominant.  There    was a large PDA and PLA from Alexander distal circumflex that was jeopardized    and ungrafted. 4. Native right coronary artery:  Was totally occluded in its midportion.    It was a nondominant vessel.  Alexander conus branch gave collaterals to Alexander    distal marginal, and there was an RV branch before Alexander mid-RCA occlusion    with no  other antegrade filling of this nondominant vessel. 5. Alexander left internal mammary artery:  Was widely patent.  There was a moderate-    sized thoracic branch that was not clipped.  There was an excellent    anastomosis to Alexander mid-LAD.  Beyond Alexander mid-LAD there was an area of 50%-70%    segmental narrowing.  This did not appear critical and there was good    TIMI-3 flow to Alexander distal LAD, which bifurcated to Alexander apex.  There was    retrograde filling of Alexander LAD as well.  In Alexander distal third of Alexander LIMA    graft there was a composite radial graft, which had excellent anastomosis    to Alexander LIMA.  Alexander first sequential anastomosis was to Alexander large second    diagonal, which was done at a bifurcation point, with stenosis beyond this    as outlined, and Alexander second sequential anastomosis was done to Alexander optional    diagonal, which was totally occluded at this point  (superior branch).    Alexander RIMA was widely patent and was ungrafted.  There was no branchiocephalic    or subclavian stenosis.  DISCUSSION:  Alexander patients history is as outlined above.  He has very complex anatomy, and has recently had arterial revascularization at [facility] in  September 2000.  He now presents with recurrent angina, compatible with ischemia. He has multiple native lesions that are unprotected.  He may require a redo CABG. I believe, because of his main left coronary artery disease of approximately 50%, and several ostial proximal circumflex lesions, as well as Alexander bifurcation lesion in this dominant vessel, which would probably require bifurcation stenting, that this patient might best be served with a redo CABG.  Vessels potentially suitable for grafting may include Alexander large first diagonal, which was ungrafted, Alexander branches of Alexander second diagonal beyond Alexander radial insertion, possibly Alexander optional diagonal, and Alexander large distal dominant circumflex branches.  Alexander LAD beyond Alexander LIMA has disease, and I would recommend medical therapy of this, and we could consider a percutaneous intervention for this if it progressed, but this does not appear to be Alexander culprit lesion at present.  CATHETERIZATION DIAGNOSES: 1. Arteriosclerotic heart disease - coronary artery bypass grafting x 3    at [facility] in September 2000. 2. Recurrent angina, unstable, without a myocardial infarction. 3. Patent left internal mammary artery to Alexander left anterior descending coronary    artery. 4. Patent composite radial graft from Alexander distal third of Alexander left internal    mammary graft to Alexander diagonal-II and optional diagonal, with disease as    outlined beyond Alexander insertions of diagonal-II and optional diagonal. 5. Significant native dominant circumflex disease and 50% main left disease. 6. Well-preserved left ventricular function with ejection fraction of    approximately 50%  from prior angiogram on January 17, 1999. 7. Exogenous obesity. 8. Nonsmoker. 9. Hyperlipidemia.DD:  01/19/99 TD:  01/19/99 Job: [id number]

## 2010-05-26 NOTE — Op Note (Signed)
[place]  Patient:    Alexander Wilkinson               MRN: [id number] Proc. Date: 01/25/99 Adm. Date:  [id number] Attending:  Waldo Laine CC:         Gwenith Daily. Tyrone Sage, M.D.             Richard A. Alanda Amass, M.D.             Dr. 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  POSTOPERATIVE DIAGNOSIS:  Early graft failure status post coronary artery bypass grafting.  OPERATION PERFORMED:  Redo coronary artery bypass grafting x 4 with sequential reversed saphenous vein graft to diagonal and intermediate coronary artery and sequential reversed saphenous vein to the distal right coronary artery and distal circumflex.  SURGEON:  Gwenith Daily. Tyrone Sage, M.D.  FIRST ASSISTANT:  Alleen Borne, M.D.  SECOND ASSISTANT:  Eugenia Pancoast, P.A.  ANESTHESIA:  BRIEF HISTORY:  The patient is a 55 year old male who underwent coronary artery  bypass grafting x 3 with the left internal mammary artery to the left anterior descending coronary artery and a left radial artery from the left internal mammary artery sequentially to the first diagonal and to the intermediate coronary artery in September of 2000 done at [facility].  At  that time the patient was done urgently with intra-aortic balloon pump in place. He recovered uneventfully and then was discharged home.  He did well until January 10, when he had recurrent chest pain.  He was seen in [place] and underwent repeat cardiac catheterization and was referred to [facility]. Consideration for patient at time or cath and subsequent repeat cath at [facility] revealed total occlusion of the right coronary artery with a circumflex vessel with sequential 85 to 90% lesion supplying the inferior surface of  the heart.  He had a patent left internal mammary artery graft to the LAD, a radial  graft arose from this but there was stenosis just distal to the anastomosis to he diagonal and subtotal occlusion of the intermediate ____________ of the graft insertion site.  Because of patients recurrent anginal symptoms and inferior surface not revascularized, an early radial artery failure, redo coronary artery bypass grafting was recommended.  This was after consideration for angioplasty ut it was not felt that this would be possible.  The patient was also noted to have a 60 to 80% left internal carotid artery stenosis by Doppler which was asymptomatic. The risks and options were discussed with the patient in detail including the risk of death, infection, stroke, myocardial infarction, bleeding and blood transfusion. Patient was agreeable with proceeding and signed informed consent.  DESCRIPTION OF PROCEDURE:  With Swan-Ganz and arterial line monitors in place, he patient underwent general endotracheal anesthesia without incident.  A right femoral arterial line was placed.  A median sternotomy was performed with a sagittal saw.  The previous pericardium had not been closed.  The aorta was adherent to the posterior table of the sternum and had to be dissected free. The ascending aorta and the right atrium were dissected out.  In addition, the anterior surface of the heart was dissected out.  The site of the mammary artery  was identified as was the radial artery.  The patient was then systemically heparinized.  The ascending aorta and the right atrium were cannulated.  The remainder of the dissection was carried out which was somewhat difficult because of the number of adhesions early after surgery, particularly to the inferior and lateral wall of the heart which needed grafts.  With the remainder of the heart  dissected out, sites for anastomosis were selected and dissected out of  the epicardium..  The patients body temperature was cooled to 28 degrees.  The aortic crossclamp was applied.  500 cc of cold blood potassium cardioplegia was administered.  A bulldog was placed on the mammary artery prior to the take off of the radial graft.  Attention was then turned to the diagonal coronary artery which was opened and was admitted a 1.5 probe.  Using a diamond type side-to-side anastomoses were carried out with a segment reversed saphenous vein graft.  The  distal extent of the same vein was then carried a short distance to the intermediate coronary artery which was opened and admitted a 1.5 mm probe. Using a running 7-0 Prolene, distal anastomosis was performed. Additional cold blood cardioplegia was administered down the vein grafts.  Attention was then turned o the inferior surface of the heart.  The distal right coronary artery just at the take off of the posterior descending branch was identified and opened and the vessel was approximately 1.3 to 1.4 mm in size.  Using a diamond type side-to-side anastomosis was carried out with a running 7-0 Prolene.  The distal extent of the same vein was then carried to the inferior lateral surface of the heart where the distal circumflex was identified.  This vessel was also 1.3 to 1.4 mm in size.  Using a running 7-0 Prolene, a distal anastomosis was performed.  Additional blood cardioplegia was administered down the vein graft to test the anastomoses. They were free of bleeding.  The aortic crossclamp was then removed.  Total crossclamp time of 75 minutes.  The mammary bulldog was also removed with prompt rise in myocardial septal temperature.  The patient spontaneously converted to a sinus rhythm.  A partial occlusion clamp was placed on the ascending aorta.  Two punch aortotomies were performed.  Each of the two vein grafts were anastomosed to the ascending aorta.  Air was evacuated from the grafts and partial  occlusion clamp was removed.  Sites of anastomosis were inspected and were free of bleeding. Patient was then ventilated and weaned from cardiopulmonary bypass on low dose dopamine.  He remained hemodynamically stable.  He was decannulated in the usual fashion.  Protamine sulfate was administered.  With the operative field hemostatic, two mediastinal tubes were left in place.  Pleural flaps were developed anteriorly ver the ascending aorta to protect the vein grafts.  There was not suitable pericardium to completely protect the anterior right ventricle.  The sternum was closed with #6 stainless steel wire, the fascia was closed with interrupted 0 Vicryl and running 3-0 Vicryl in the subcutaneous tissue and a 4-0 subcuticular stitch in the skin  edges.  Dry dressings were applied.  Sponge count was reported as correct.  One  needle was missing and a chest x-ray was obtained prior to leaving the room. DD:  01/25/99 TD:  01/26/99 Job: [id number]

## 2010-05-26 NOTE — Consult Note (Signed)
[place]  Patient:    Alexander Wilkinson               MRN: [id number] Proc. Date: 01/19/99 Adm. Date:  [id number] Attending:  Berry, Jonathan [place] Dictator:   [id number] CC:         Gwenith Daily. Tyrone Sage, M.D. (office)                          Consultation Report  REASON FOR CONSULTATION:  Recurrent coronary occlusive disease.  HISTORY OF PRESENT ILLNESS:  The patient is a 55 year old male with no history f diabetes or smoking who presented with severe chest pain in the September and was taken to [facility] by helicopter.  At that time he underwent coronary artery bypass grafting.  The patient reports x 3.  This was done with the use of a radial artery and one internal mammary artery.  The details of this operative report we do not have.  The patient was told that the vessels on the back of his heart were oo small to bypass.  This was assumed to represent the circumflex system.  He did ell following surgery and was discharged after four days and had been enrolled in cardiac rehab in [place] without difficulty.  On January 17, 1999, he began  having substernal chest pain with discomfort radiating into his left arm. He was taken to [facility].  He was stabilized medically and underwent cardiac catheterization by Dr. ___ .  Following this, he was referred to Dr. Alanda Amass for angioplasty.  Cardiac catheterization was again performed today but no angioplasty was done.  Consultation for opinion was requested.  PAST MEDICAL HISTORY: 1. Atherosclerotic coronary artery disease. 2. History of hyperlipidemia.  FAMILY HISTORY:  The patients mother died at age 76 of myocardial infarction in  the 49s.  He has one brother who has mental illness but no history of coronary artery disease.  REVIEW OF SYSTEMS:  The patient has had some visual changes with occasional blurry vision in the right eye.  Denies any classic  TIA symptoms.  Has had no TIA symptoms involving the extremities on either side.  Denies any history of smoking or respiratory problems.  Denies any history of GI bleed.  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  CURRENT MEDICATIONS:  Atenolol, Pravachol and one aspirin a day.  PHYSICAL EXAMINATION:  GENERAL:  The patient appears older than his stated age.  VITAL SIGNS:  Blood pressure 120/60, heart rate 58.  Sinus respiratory rate is 8.  NECK:  Supple without carotid bruits.  LUNGS:  Clear bilaterally.  HEART:  He has a normal S1 and S2 without murmurs or gallops.  ABDOMEN:  Exam is benign without palpable organomegaly.  LOWER EXTREMITIES:  The patient has 2+ DP and PT pulses and has adequate vein for bypass.  ASSESSMENT:  I have reviewed the most recent films but before reaching a firm decision about a recommendation, I would like to obtain the patients operative report from [facility] and also review his original films from [place]. It appears that the primary difficulty is with the radial artery anastomosis. There appears to competitive flow and a stenosis of the intermediate vessel. In addition, the patient has disease in the circumflex vessel which supplies the PD and PL which was not bypassed.  The patient was told after surgery that these vessels were too small to bypass.  After obtained further information, will review the films with Dr. Alanda Amass before we make a formal decision about how to proceed. The patient also is noted to have a 60 to 80% stenosis of the left internal carotid artery, probably closer to 80% than 60%. This is asymptomatic currently. DD:  01/19/99 TD:  01/19/99 Job: [id number]

## 2010-10-20 LAB — APTT
aPTT: 25
aPTT: 25

## 2010-10-20 LAB — CBC
HCT: 37.4 — ABNORMAL LOW
Hemoglobin: 11.6 — ABNORMAL LOW
Hemoglobin: 12.4 — ABNORMAL LOW
MCHC: 33.2
MCHC: 33.5
MCV: 86.8
MCV: 88
MCV: 88.1
Platelets: 218
Platelets: 227
RBC: 3.83 — ABNORMAL LOW
RBC: 3.92 — ABNORMAL LOW
RDW: 12.8
RDW: 13.5
WBC: 6.8
WBC: 8.3

## 2010-10-20 LAB — COMPREHENSIVE METABOLIC PANEL
ALT: 42
AST: 23
Alkaline Phosphatase: 59
BUN: 16
CO2: 32
Calcium: 9.6
Chloride: 104
Creatinine, Ser: 0.84
Creatinine, Ser: 0.98
GFR calc Af Amer: >60
GFR calc non Af Amer: >60
GFR calc non Af Amer: >60
Glucose, Bld: 123 — ABNORMAL HIGH
Potassium: 4.5
Sodium: 142
Total Bilirubin: 0.5
Total Protein: 7.1

## 2010-10-20 LAB — BASIC METABOLIC PANEL
Calcium: 8.6
Chloride: 101
Chloride: 99
Creatinine, Ser: 0.95
Creatinine, Ser: 1.12
GFR calc Af Amer: >60
GFR calc Af Amer: >60
Potassium: 4
Sodium: 136
Sodium: 136

## 2010-10-20 LAB — PROTIME-INR
INR: 0.9
Prothrombin Time: 12.7

## 2010-10-20 LAB — CK TOTAL AND CKMB (NOT AT ARMC): Total CK: 164

## 2010-10-20 LAB — URINALYSIS, ROUTINE W REFLEX MICROSCOPIC
Glucose, UA: NEGATIVE
Ketones, ur: NEGATIVE
Nitrite: NEGATIVE
Protein, ur: NEGATIVE
pH: 7.5

## 2010-10-20 LAB — TYPE AND SCREEN: ABO/RH(D): A POS

## 2010-10-20 LAB — TROPONIN I
Troponin I: 0.02
Troponin I: 0.02

## 2010-12-08 ENCOUNTER — Other Ambulatory Visit: Payer: Self-pay | Admitting: Cardiovascular Disease

## 2010-12-08 ENCOUNTER — Ambulatory Visit
Admission: RE | Admit: 2010-12-08 | Discharge: 2010-12-08 | Disposition: A | Discharge status: Home / Self Care | Payer: Medicare Other | Source: Ambulatory Visit | Attending: Cardiovascular Disease | Admitting: Cardiovascular Disease

## 2010-12-08 DIAGNOSIS — R0602 Shortness of breath: Secondary | ICD-10-CM

## 2010-12-11 ENCOUNTER — Encounter (HOSPITAL_COMMUNITY): Payer: Self-pay | Admitting: Pharmacy Technician

## 2010-12-12 ENCOUNTER — Encounter (HOSPITAL_COMMUNITY)
Admission: RE | Disposition: A | Discharge status: Home / Self Care | Payer: Self-pay | Source: Ambulatory Visit | Attending: Cardiovascular Disease

## 2010-12-12 ENCOUNTER — Ambulatory Visit (HOSPITAL_COMMUNITY)
Admission: RE | Admit: 2010-12-12 | Discharge: 2010-12-12 | Disposition: A | Discharge status: Home / Self Care | Payer: Medicare Other | Source: Ambulatory Visit | Attending: Cardiovascular Disease | Admitting: Cardiovascular Disease

## 2010-12-12 DIAGNOSIS — G4733 Obstructive sleep apnea (adult) (pediatric): Secondary | ICD-10-CM | POA: Insufficient documentation

## 2010-12-12 DIAGNOSIS — I2510 Atherosclerotic heart disease of native coronary artery without angina pectoris: Secondary | ICD-10-CM | POA: Insufficient documentation

## 2010-12-12 DIAGNOSIS — Z951 Presence of aortocoronary bypass graft: Secondary | ICD-10-CM | POA: Insufficient documentation

## 2010-12-12 DIAGNOSIS — Z9861 Coronary angioplasty status: Secondary | ICD-10-CM | POA: Insufficient documentation

## 2010-12-12 DIAGNOSIS — E785 Hyperlipidemia, unspecified: Secondary | ICD-10-CM | POA: Insufficient documentation

## 2010-12-12 DIAGNOSIS — I10 Essential (primary) hypertension: Secondary | ICD-10-CM | POA: Insufficient documentation

## 2010-12-12 DIAGNOSIS — Z8249 Family history of ischemic heart disease and other diseases of the circulatory system: Secondary | ICD-10-CM | POA: Insufficient documentation

## 2010-12-12 DIAGNOSIS — I2582 Chronic total occlusion of coronary artery: Secondary | ICD-10-CM | POA: Insufficient documentation

## 2010-12-12 HISTORY — PX: LEFT HEART CATHETERIZATION WITH CORONARY/GRAFT ANGIOGRAM: SHX5450

## 2010-12-12 SURGERY — LEFT HEART CATHETERIZATION WITH CORONARY/GRAFT ANGIOGRAM
Anesthesia: LOCAL

## 2010-12-12 MED ORDER — NITROGLYCERIN 0.2 MG/ML ON CALL CATH LAB
INTRAVENOUS | Status: AC
  Filled 2010-12-12: qty 1

## 2010-12-12 MED ORDER — MIDAZOLAM HCL 2 MG/2ML IJ SOLN
INTRAMUSCULAR | Status: AC
  Filled 2010-12-12: qty 2

## 2010-12-12 MED ORDER — ACETAMINOPHEN 325 MG PO TABS: 650.0000 mg | ORAL_TABLET | ORAL | Status: DC | PRN

## 2010-12-12 MED ORDER — HEPARIN (PORCINE) IN NACL 2-0.9 UNIT/ML-% IJ SOLN
INTRAMUSCULAR | Status: AC
  Filled 2010-12-12: qty 2000

## 2010-12-12 MED ORDER — SODIUM CHLORIDE 0.9 % IV SOLN: INTRAVENOUS | Status: DC

## 2010-12-12 MED ORDER — LIDOCAINE HCL (PF) 1 % IJ SOLN
INTRAMUSCULAR | Status: AC
  Filled 2010-12-12: qty 30

## 2010-12-12 MED ORDER — DIAZEPAM 5 MG PO TABS
5.0000 mg | ORAL_TABLET | ORAL | Status: AC
  Administered 2010-12-12: 5 mg via ORAL
  Filled 2010-12-12: qty 1

## 2010-12-12 MED ORDER — SODIUM CHLORIDE 0.9 % IV SOLN
INTRAVENOUS | Status: DC
  Administered 2010-12-12: 13:00:00 via INTRAVENOUS

## 2010-12-12 MED ORDER — FENTANYL CITRATE 0.05 MG/ML IJ SOLN
INTRAMUSCULAR | Status: AC
Start: 2010-12-12 — End: 2010-12-12
  Filled 2010-12-12: qty 2

## 2010-12-12 MED ORDER — OXYCODONE-ACETAMINOPHEN 5-325 MG PO TABS
ORAL_TABLET | ORAL | Status: AC
  Filled 2010-12-12: qty 1

## 2010-12-12 MED ORDER — SODIUM CHLORIDE 0.9 % IJ SOLN: 3.0000 mL | INTRAMUSCULAR | Status: DC | PRN

## 2010-12-12 MED ORDER — ONDANSETRON HCL 4 MG/2ML IJ SOLN: 4.0000 mg | Freq: Four times a day (QID) | INTRAMUSCULAR | Status: DC | PRN

## 2010-12-12 MED ORDER — OXYCODONE-ACETAMINOPHEN 5-325 MG PO TABS
1.0000 | ORAL_TABLET | Freq: Once | ORAL | Status: AC
  Administered 2010-12-12: 1 via ORAL

## 2010-12-12 NOTE — Interval H&P Note (Signed)
History and Physical Interval Note:  12/12/2010 3:59 PM  Alexander Wilkinson  has presented today for surgery, with the diagnosis of abnormal cardiolite  The various methods of treatment have been discussed with the patient and family. After consideration of risks, benefits and other options for treatment, the patient has consented to  Procedure(s): LEFT HEART CATHETERIZATION WITH CORONARY/GRAFT ANGIOGRAM as a surgical intervention .  The patients' history has been reviewed, patient examined, no change in status, stable for surgery.  I have reviewed the patients' chart and labs.  Questions were answered to the patient's satisfaction.     Runell Gess

## 2010-12-12 NOTE — Op Note (Signed)
Alexander Wilkinson is a 55 y.o. male    [id number] LOCATION:  FACILITY: [facility]  PHYSICIAN: Alexander Wilkinson, M.D. 06-01-1955   DATE OF PROCEDURE:  12/12/2010  DATE OF DISCHARGE:  [facility]  CARDIAC CATHETERIZATION     History obtained from chart review.the patient is a 55 year old Caucasian male father of 4, grandfather to 2 grandchildren who I saw him in the office 12/08/2010. His risk factors include a strong family history of heart disease, hypertension, and hyperlipidemia. He does have chronic obstructive sleep apnea and wears CPAP. His court he has had coronary artery bypass graft in 2000 with early graft failure and redo bypass grafting Dr. Sheliah Wilkinson. He's also had left carotid endarterectomy in 2003 and stenting in 2006 for restenosis. He was catheterized by Dr. Lenise Wilkinson in June 2006 and was found to have patent grafts. His EF is 50%. He was recatheterized by Dr. Danella Wilkinson and Alexander Wilkinson 02/23/2006 and had several drug-eluting stents placed in his proximal left internal mammary artery bypass graft. Procedure was complicated by right femoral dissection requiring surgical revascularization by Dr. Berna Wilkinson. He has also had EECP treatment in the past.the chest pain as well as left upper extremity discomfort which is excruciating and had a normal Myoview stress test in September of this year. Has ongoing symptoms he presents now for outpatient diagnostic cardiac catheterization to define his anatomy   PROCEDURE DESCRIPTION:  The patient was brought to the second floor [facility] cardiac catheterization laboratory in the postabsorptive state. He was premedicated by mouth Valium, IV Versed and fentanyl. His right groin was prepped and shaved in the usual sterile fashion. 1% Xylocaine was used for local anesthesia. A 5 French sheath was inserted into the right femoral artery using standard Seldinger technique. A 5 French right and left  Judkins diagnostic catheters long 5 French pigtail catheter and internal mammary artery catheter were used for selective coronary angiography, selective vein graft and IMA angiography as well as left ventriculography. Visipaque dye was used for the entirety of the case. Retrograde aortic, left ventricular and pullback pressures were recorded.      HEMODYNAMICS:    AO SYSTOLIC/AO DIASTOLIC: 146/87   LV SYSTOLIC/LV DIASTOLIC: 140/26  ANGIOGRAPHIC RESULTS:   1. Left main; 40%  2. LAD; long tubular 70-90% involving the entire proximal and mid third of the vessel 3. Left circumflex; long tubular proximal 60% followed by a focal 90% mid AV groove circumflex in sequence with a 60-70% circumflex stenosis just prior to the first marginal branch. There was competitive flow distally.  4. Right coronary artery; totally occluded in its proximal portion 5.LIMA TO LAD; patent with obvious radiographically visible stents 6. SVG TO 2-D posterior descending artery as well as the distal circumflex was widely patent     SVG TO sequential vein graft to the diagonal branch and ramus intermedius branch was widely patent    7. Left ventriculography; RAO left ventriculogram was performed using  25 mL of Visipaque dye at 12 mL/second. The overall LVEF estimated  50-55% % Without wall motion abnormalities  IMPRESSION:Alexander Wilkinson has widely patent grafts and well-preserved left ventricular function. This corresponds with his recent negative Myoview stress test. There are no obvious areas of jeopardized myocardium and continued medical therapy will be recommended. The sheath was removed and pressure was held on the groin to achieve hemostasis. The patient left lap in stable condition. We discharged him today as an outpatient and will see me back in  the office in approximately one to 2 weeks.  Alexander Gess MD, [facility] 12/12/2010 4:52 PM

## 2010-12-12 NOTE — H&P (Signed)
  H & P will be scanned in.  Pt was reexamined and existing H & P reviewed. No changes found.  Runell Gess, MD [facility] 12/12/2010 3:59 PM

## 2012-04-02 ENCOUNTER — Encounter: Payer: Self-pay | Admitting: Cardiology

## 2012-04-17 ENCOUNTER — Ambulatory Visit: Payer: Medicare Other | Admitting: Cardiology

## 2012-05-12 ENCOUNTER — Encounter: Payer: Self-pay | Admitting: Cardiology

## 2012-05-12 DIAGNOSIS — I10 Essential (primary) hypertension: Secondary | ICD-10-CM | POA: Insufficient documentation

## 2012-05-12 DIAGNOSIS — I63239 Cerebral infarction due to unspecified occlusion or stenosis of unspecified carotid arteries: Secondary | ICD-10-CM | POA: Insufficient documentation

## 2012-05-12 DIAGNOSIS — I2510 Atherosclerotic heart disease of native coronary artery without angina pectoris: Secondary | ICD-10-CM | POA: Insufficient documentation

## 2012-05-12 DIAGNOSIS — E782 Mixed hyperlipidemia: Secondary | ICD-10-CM | POA: Insufficient documentation

## 2012-05-16 ENCOUNTER — Ambulatory Visit (INDEPENDENT_AMBULATORY_CARE_PROVIDER_SITE_OTHER): Payer: Medicare Other | Admitting: Cardiology

## 2012-05-16 ENCOUNTER — Encounter: Payer: Self-pay | Admitting: Cardiology

## 2012-05-16 VITALS — BP 100/65 | HR 81 | Ht 69.0 in | Wt 266.0 lb

## 2012-05-16 DIAGNOSIS — E782 Mixed hyperlipidemia: Secondary | ICD-10-CM

## 2012-05-16 DIAGNOSIS — I63239 Cerebral infarction due to unspecified occlusion or stenosis of unspecified carotid arteries: Secondary | ICD-10-CM

## 2012-05-16 DIAGNOSIS — I779 Disorder of arteries and arterioles, unspecified: Secondary | ICD-10-CM

## 2012-05-16 DIAGNOSIS — I10 Essential (primary) hypertension: Secondary | ICD-10-CM

## 2012-05-16 DIAGNOSIS — I2510 Atherosclerotic heart disease of native coronary artery without angina pectoris: Secondary | ICD-10-CM

## 2012-05-16 DIAGNOSIS — I743 Embolism and thrombosis of arteries of the lower extremities: Secondary | ICD-10-CM

## 2012-05-16 NOTE — Assessment & Plan Note (Signed)
Multivessel disease as outlined above status post revascularization, both percutaneous and surgical. He has regular angina symptoms on medical therapy, although in this setting had documentation of patent grafts as of December 2012. 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 We discussed diet, exercise, goal for weight loss. Otherwise continue medical therapy and arrange followup in 6 months.

## 2012-05-16 NOTE — Assessment & Plan Note (Signed)
History of right femoral endarterectomy. Will arrange followup lower extremity arterial studies for next visit.

## 2012-05-16 NOTE — Patient Instructions (Addendum)
Your physician recommends that you schedule a follow-up appointment in: 6 months. You will receive a reminder letter in the mail in about 4 months reminding you to call and schedule your appointment. If you don't receive this letter, please contact our office. Your physician recommends that you continue on your current medications as directed. Please refer to the Current Medication list given to you today. Your physician has requested that you have a carotid duplex in 6 months. This test is an ultrasound of the carotid arteries in your neck. It looks at blood flow through these arteries that supply the brain with blood. Allow one hour for this exam. There are no restrictions or special instructions. Your physician has requested that you have an ankle brachial index (ABI) in 6 months. During this test an ultrasound and blood pressure cuff are used to evaluate the arteries that supply the arms and legs with blood. Allow thirty minutes for this exam. There are no restrictions or special instructions.

## 2012-05-16 NOTE — Assessment & Plan Note (Signed)
Blood pressure is normal today. 

## 2012-05-16 NOTE — Assessment & Plan Note (Signed)
Check carotid duplex for next visit.

## 2012-05-16 NOTE — Assessment & Plan Note (Signed)
Tolerating high dose Crestor, followed by Dr. Truddie Crumble.

## 2012-05-16 NOTE — Progress Notes (Signed)
Clinical Summary Alexander Wilkinson is a 57 y.o.male with complex cardiovascular disease most recently followed by Alexander Wilkinson. He is referred to establish care with [place] by Alexander Wilkinson.  History reviewed below. He was seen by Alexander Wilkinson last in 2012, underwent a myocardial perfusion study in September of that year that demonstrated an LVEF of 54% with no diagnostic ECG changes and overall normal perfusion. He ultimately underwent cardiac catheterization by Alexander Wilkinson in December 2012 due to ongoing chest pain symptoms. At that time he had patent bypass grafts including LIMA to LAD including stent sites, SVG to PDA and distal circumflex, and SVG to diagonal and ramus intermedius.  Recent lab work in March showed potassium 4.5, creatinine 0.9, BUN 20, cholesterol 128, HDL 36, LDL 49, triglycerides 212, AST 18.  ECG today shows sinus rhythm with nonspecific ST changes.  He reports a fairly long term history of regular angina symptoms, on high-dose nitrates, presumably has significant degree of microvascular angina at least based on his most recent cardiac catheterization findings. He was previously treated with EECP per Alexander Wilkinson. He reports no escalation in symptoms. Does not exercise with any regularity at this point. Has has intermittent left arm pain. We reviewed his medications, discussed a reasonable diet and exercise plan. Needs to focus on losing some weight.  He has not had any recent followup of his carotids or leg PAD.  No Known Allergies  Current Outpatient Prescriptions  Medication Sig Dispense Refill  . amitriptyline (ELAVIL) 10 MG tablet Take 10 mg by mouth at bedtime.        Marland Kitchen aspirin EC 81 MG tablet Take 81 mg by mouth daily.        Marland Kitchen atenolol (TENORMIN) 25 MG tablet Take 12.5 mg by mouth 2 (two) times daily.        . clopidogrel (PLAVIX) 75 MG tablet Take 75 mg by mouth daily.        . diphenhydrAMINE (SOMINEX) 25 MG tablet Take 25 mg by mouth 2 (two) times daily as  needed.        . fluticasone (FLONASE) 50 MCG/ACT nasal spray Place 2 sprays into the nose daily.      . isosorbide mononitrate (IMDUR) 60 MG 24 hr tablet Take 60 mg by mouth daily.        . nitroGLYCERIN (NITRODUR - DOSED IN MG/24 HR) 0.6 mg/hr Place 1 patch onto the skin daily.        . nitroGLYCERIN (NITRODUR - DOSED IN MG/24 HR) 0.6 mg/hr Place 1 patch onto the skin daily.      . nitroGLYCERIN (NITROSTAT) 0.4 MG SL tablet Place 0.4 mg under the tongue every 5 (five) minutes as needed for chest pain.      . rosuvastatin (CRESTOR) 40 MG tablet Take 40 mg by mouth daily.         No current facility-administered medications for this visit.    Past Medical History  Diagnosis Date  . Coronary atherosclerosis of native coronary artery     Multivessel s/p CABG with redo, DES to LIMA - Alexander Wilkinson 2005, patent grafts at catheterization 2012 - Alexander Wilkinson [facility])  . Depression   . Essential hypertension, benign   . OSA (obstructive sleep apnea)   . Mixed hyperlipidemia   . Peripheral Wilkinson disease   . Carotid artery disease     Left carotid stent 2002 at [facility]  . History of TIA (transient ischemic attack)     Past Surgical  History  Procedure Laterality Date  . Coronary artery bypass graft  2000, 2001    LIMA to LAD, left radial to diagonal - then redo with SVG to ramus/diagonal, SVG to RCA/OM  . Left cea    . Right femoral endarterectomy    . Back surgery    . Knee surgery      Family History  Problem Relation Age of Onset  . CAD Mother     MI age 36  . Mental illness Brother     Social History Alexander Wilkinson reports that he has never smoked. He has never used smokeless tobacco. Alexander Wilkinson reports that he does not drink alcohol.  Review of Systems No palpitations or syncope. No bleeding problems on DAPT. Chronic mild leg edema, left greater than right. No orthopnea or PND. Otherwise as outlined.  Physical Examination Filed Vitals:   05/16/12 1357  BP: 100/65  Pulse: 81    Filed Weights   05/16/12 1357  Weight: 266 lb (120.657 kg)   Morbidly obese male in no acute distress. HEENT: Conjunctiva and lids normal, oropharynx clear. Neck: Supple, no elevated JVP, no thyromegaly. Lungs: Clear to auscultation, nonlabored breathing at rest. Cardiac: Distant regular rate and rhythm, no S3 or significant systolic murmur, no pericardial rub. Abdomen: Soft, nontender, bowel sounds present. Extremities: No pitting edema, distal pulses 1+. Left radial harvest. Skin: Warm and dry. Musculoskeletal: No kyphosis. Neuropsychiatric: Alert and oriented x3, affect grossly appropriate.   Problem List and Plan   Coronary atherosclerosis of native coronary artery Multivessel disease as outlined above status post revascularization, both percutaneous and surgical. He has regular angina symptoms on medical therapy, although in this setting had documentation of patent grafts as of December 2012. 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 We discussed diet, exercise, goal for weight loss. Otherwise continue medical therapy and arrange followup in 6 months.  Carotid artery occlusion with cerebral infarction Check carotid duplex for next visit.  Peripheral arterial occlusive disease History of right femoral endarterectomy. Will arrange followup lower extremity arterial studies for next visit.  Essential hypertension, benign Blood pressure is normal today.  Mixed hyperlipidemia Tolerating high dose Crestor, followed by Alexander Wilkinson.    Alexander Wilkinson, M.D., F.A.C.C.

## 2012-05-21 ENCOUNTER — Other Ambulatory Visit: Payer: Self-pay | Admitting: *Deleted

## 2012-05-21 MED ORDER — CLOPIDOGREL BISULFATE 75 MG PO TABS: 75.0000 mg | ORAL_TABLET | Freq: Every day | ORAL | Status: DC

## 2012-06-05 ENCOUNTER — Other Ambulatory Visit: Payer: Self-pay | Admitting: *Deleted

## 2012-06-05 MED ORDER — CLOPIDOGREL BISULFATE 75 MG PO TABS: 75.0000 mg | ORAL_TABLET | Freq: Every day | ORAL | Status: DC

## 2012-06-05 MED ORDER — ATENOLOL 25 MG PO TABS: 12.5000 mg | ORAL_TABLET | Freq: Two times a day (BID) | ORAL | Status: DC

## 2012-06-05 MED ORDER — ROSUVASTATIN CALCIUM 40 MG PO TABS: 40.0000 mg | ORAL_TABLET | Freq: Every day | ORAL | Status: DC

## 2012-06-05 MED ORDER — ISOSORBIDE MONONITRATE ER 60 MG PO TB24: 60.0000 mg | ORAL_TABLET | Freq: Every day | ORAL | Status: DC

## 2012-06-30 ENCOUNTER — Telehealth: Payer: Self-pay | Admitting: Cardiovascular Disease

## 2012-08-28 ENCOUNTER — Other Ambulatory Visit: Payer: Self-pay | Admitting: *Deleted

## 2012-08-28 ENCOUNTER — Other Ambulatory Visit: Payer: Self-pay | Admitting: Cardiology

## 2012-08-28 MED ORDER — NITROGLYCERIN 0.6 MG/HR TD PT24: 1.0000 | MEDICATED_PATCH | Freq: Every day | TRANSDERMAL | Status: DC

## 2012-08-28 NOTE — Telephone Encounter (Signed)
Medication refused. Patient seen at [facility]

## 2013-02-16 ENCOUNTER — Other Ambulatory Visit: Payer: Self-pay | Admitting: Cardiology

## 2013-02-16 ENCOUNTER — Encounter: Payer: Self-pay | Admitting: Cardiology

## 2013-02-16 MED ORDER — NITROGLYCERIN 0.6 MG/HR TD PT24: 0.6000 mg | MEDICATED_PATCH | Freq: Every day | TRANSDERMAL | Status: DC

## 2013-03-09 ENCOUNTER — Other Ambulatory Visit: Payer: Self-pay | Admitting: Cardiology

## 2013-03-09 MED ORDER — CLOPIDOGREL BISULFATE 75 MG PO TABS: 75.0000 mg | ORAL_TABLET | Freq: Every day | ORAL | Status: DC

## 2013-03-09 MED ORDER — ATENOLOL 25 MG PO TABS: 12.5000 mg | ORAL_TABLET | Freq: Two times a day (BID) | ORAL | Status: DC

## 2013-03-09 MED ORDER — ROSUVASTATIN CALCIUM 40 MG PO TABS: 40.0000 mg | ORAL_TABLET | Freq: Every day | ORAL | Status: DC

## 2013-04-30 NOTE — Telephone Encounter (Signed)
Closed encounter °

## 2013-05-05 ENCOUNTER — Ambulatory Visit (INDEPENDENT_AMBULATORY_CARE_PROVIDER_SITE_OTHER): Payer: Medicare Other | Admitting: Cardiology

## 2013-05-05 ENCOUNTER — Encounter: Payer: Self-pay | Admitting: Cardiology

## 2013-05-05 VITALS — BP 112/75 | HR 75 | Ht 69.0 in | Wt 266.1 lb

## 2013-05-05 DIAGNOSIS — I10 Essential (primary) hypertension: Secondary | ICD-10-CM

## 2013-05-05 DIAGNOSIS — I743 Embolism and thrombosis of arteries of the lower extremities: Secondary | ICD-10-CM

## 2013-05-05 DIAGNOSIS — I2510 Atherosclerotic heart disease of native coronary artery without angina pectoris: Secondary | ICD-10-CM

## 2013-05-05 DIAGNOSIS — E782 Mixed hyperlipidemia: Secondary | ICD-10-CM

## 2013-05-05 DIAGNOSIS — I779 Disorder of arteries and arterioles, unspecified: Secondary | ICD-10-CM

## 2013-05-05 DIAGNOSIS — I63239 Cerebral infarction due to unspecified occlusion or stenosis of unspecified carotid arteries: Secondary | ICD-10-CM

## 2013-05-05 MED ORDER — ROSUVASTATIN CALCIUM 40 MG PO TABS: 40.0000 mg | ORAL_TABLET | Freq: Every day | ORAL | Status: DC

## 2013-05-05 MED ORDER — ISOSORBIDE MONONITRATE ER 60 MG PO TB24: 60.0000 mg | ORAL_TABLET | Freq: Every day | ORAL | Status: DC

## 2013-05-05 MED ORDER — ATENOLOL 25 MG PO TABS: 12.5000 mg | ORAL_TABLET | Freq: Two times a day (BID) | ORAL | Status: DC

## 2013-05-05 MED ORDER — NITROGLYCERIN 0.6 MG/HR TD PT24: 0.6000 mg | MEDICATED_PATCH | Freq: Every day | TRANSDERMAL | Status: DC

## 2013-05-05 MED ORDER — NITROGLYCERIN 0.4 MG/SPRAY TL SOLN: 1.0000 | Status: DC | PRN

## 2013-05-05 MED ORDER — CLOPIDOGREL BISULFATE 75 MG PO TABS: 75.0000 mg | ORAL_TABLET | Freq: Every day | ORAL | Status: DC

## 2013-05-05 NOTE — Assessment & Plan Note (Signed)
History of right femoral endarterectomy. Followup lower extremity arterial studies will be obtained.

## 2013-05-05 NOTE — Assessment & Plan Note (Signed)
Stable angina on medical therapy. Recommended a basic walking regimen and weight loss if possible. No changes made to current regimen. Refills provided. Continue annual followup, sooner if progressive symptoms.

## 2013-05-05 NOTE — Progress Notes (Signed)
Clinical Summary [name] is a 58 y.o.male seen for the first time in our office back in May of last year. Detailed history is reviewed in prior note. Fortunately, he has been doing reasonably well with stable angina symptoms that he is tolerating. 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  He reports compliance with his medications, needed refills for cardiac medicines today. Lipids are followed by primary care. He does not get any regular exercise, we did discuss a walking regimen. His job has him driving and also sitting for long periods of time.  Last cardiac catheterization in December 2012 demonstrated patent bypass grafts including LIMA to LAD (including the stented site), SVG to PDA and distal circumflex, and SVG to diagonal and ramus intermedius.  Patient due for assessment of carotids and lower extremity arterial disease.  ECG today shows sinus rhythm with nonspecific T-wave changes.  No Known Allergies  Current Outpatient Prescriptions  Medication Sig Dispense Refill  . amitriptyline (ELAVIL) 10 MG tablet Take 10 mg by mouth at bedtime.        Marland Kitchen. aspirin EC 81 MG tablet Take 81 mg by mouth daily.        Marland Kitchen. atenolol (TENORMIN) 25 MG tablet Take 0.5 tablets (12.5 mg total) by mouth 2 (two) times daily.  90 tablet  3  . clopidogrel (PLAVIX) 75 MG tablet Take 1 tablet (75 mg total) by mouth daily.  90 tablet  3  . diphenhydrAMINE (SOMINEX) 25 MG tablet Take 25 mg by mouth 2 (two) times daily as needed.        . fluticasone (FLONASE) 50 MCG/ACT nasal spray Place 2 sprays into the nose daily.      . isosorbide mononitrate (IMDUR) 60 MG 24 hr tablet Take 1 tablet (60 mg total) by mouth daily.  90 tablet  3  . nitroGLYCERIN (NITRODUR - DOSED IN MG/24 HR) 0.6 mg/hr patch Place 1 patch (0.6 mg total) onto the skin daily.  30 patch  6  . nitroGLYCERIN (NITROLINGUAL) 0.4 MG/SPRAY spray Place 1 spray under the tongue every 5 (five) minutes  x 3 doses as needed for chest pain.  12 g  3  . rosuvastatin (CRESTOR) 40 MG tablet Take 1 tablet (40 mg total) by mouth daily.  90 tablet  3   No current facility-administered medications for this visit.    Past Medical History  Diagnosis Date  . Coronary atherosclerosis of native coronary artery     Multivessel s/p CABG with redo, DES to LIMA - Dr. Georganna SkeansPainter 2005, patent grafts [facility] catheter[facility] 2012 [facility]  . Depression   . Essential hypertension, benign   . OSA (obstructive sleep apnea)   . Mixed hyperlipidemia   . Peripheral vascular disease   . Carotid artery disease     [facility]  . History of TIA (transient ischemic attack)     Past Surgical History  Procedure Laterality Date  . Coronary artery bypass graft  2000, 2001    LIMA to LAD, left radial to diagonal - then redo with SVG to ramus/diagonal, SVG to RCA/OM  . Left cea    . Right femoral endarterectomy    . Back surgery    . Knee surgery      Social History [name] reports that he has never smoked. He has never used smokeless tobacco. [name] reports that he does not drink alcohol.  Review of Systems No palpitations. Does have regular leg cramps. Stable appetite. No syncope. Otherwise as outlined above.  Physical Examination Filed Vitals:   05/05/13 0844  BP: 112/75  Pulse: 75   Filed Weights   05/05/13 0844  Weight: 266 lb 1.9 oz (120.711 kg)    Morbidly obese male in no acute distress.  HEENT: Conjunctiva and lids normal, oropharynx clear.  Neck: Supple, no elevated JVP, no thyromegaly.  Lungs: Clear to auscultation, nonlabored breathing at rest.  Cardiac: Distant regular rate and rhythm, no S3 or significant systolic murmur, no pericardial rub.  Abdomen: Soft, nontender, bowel sounds present.  Extremities: No pitting edema, distal pulses 1+. Left radial harvest.  Skin: Warm and dry.  Musculoskeletal: No kyphosis.  Neuropsychiatric: Alert and oriented  x3, affect grossly appropriate.   Problem List and Plan   Coronary atherosclerosis of native coronary artery Stable angina on medical therapy. Recommended a basic walking regimen and weight loss if possible. No changes made to current regimen. Refills provided. Continue annual followup, sooner if progressive symptoms.  Mixed hyperlipidemia Continues on Crestor, followed by Dr. Truddie CrumbleStambaugh.  Essential hypertension, benign Blood pressure is normal today.  Carotid artery occlusion with cerebral infarction Followup carotid Dopplers will be obtained.  Peripheral arterial occlusive disease History of right femoral endarterectomy. Followup lower extremity arterial studies will be obtained.    Jonelle SidleSamuel G. Adyan Palau, M.D., F.A.C.C.

## 2013-05-05 NOTE — Assessment & Plan Note (Signed)
Blood pressure is normal today. 

## 2013-05-05 NOTE — Patient Instructions (Addendum)
Your physician has requested that you have a carotid duplex. This test is an ultrasound of the carotid arteries in your neck. It looks at blood flow through these arteries that supply the brain with blood. Allow one hour for this exam. There are no restrictions or special instructions. Lower extremity arterial doppler  Office will contact with results via phone or letter.   Continue all medications.   Your physician wants you to follow up in:  1 year.  You will receive a reminder letter in the mail one-two months in advance.  If you don't receive a letter, please call our office to schedule the follow up appointment

## 2013-05-05 NOTE — Assessment & Plan Note (Signed)
Follow-up carotid Dopplers will be obtained. 

## 2013-05-05 NOTE — Assessment & Plan Note (Signed)
Continues on Crestor, followed by Dr. Truddie CrumbleStambaugh.

## 2013-05-19 ENCOUNTER — Other Ambulatory Visit: Payer: Self-pay | Admitting: *Deleted

## 2013-05-19 DIAGNOSIS — I779 Disorder of arteries and arterioles, unspecified: Secondary | ICD-10-CM

## 2013-11-26 ENCOUNTER — Other Ambulatory Visit: Payer: Self-pay | Admitting: Cardiology

## 2013-12-16 ENCOUNTER — Encounter (HOSPITAL_COMMUNITY): Payer: Self-pay | Admitting: Cardiovascular Disease

## 2014-03-17 ENCOUNTER — Other Ambulatory Visit: Payer: Self-pay | Admitting: Cardiology

## 2014-05-29 ENCOUNTER — Other Ambulatory Visit: Payer: Self-pay | Admitting: Cardiology

## 2014-05-31 ENCOUNTER — Other Ambulatory Visit: Payer: Self-pay | Admitting: Cardiology

## 2014-06-26 ENCOUNTER — Other Ambulatory Visit: Payer: Self-pay | Admitting: Cardiology

## 2014-07-16 ENCOUNTER — Other Ambulatory Visit: Payer: Self-pay | Admitting: Cardiology

## 2014-08-03 ENCOUNTER — Other Ambulatory Visit: Payer: Self-pay | Admitting: Cardiology

## 2014-08-03 MED ORDER — ROSUVASTATIN CALCIUM 40 MG PO TABS: 40.0000 mg | ORAL_TABLET | Freq: Every day | ORAL | Status: DC

## 2014-08-03 MED ORDER — ISOSORBIDE MONONITRATE ER 60 MG PO TB24: 60.0000 mg | ORAL_TABLET | Freq: Every day | ORAL | Status: DC

## 2014-08-03 MED ORDER — CLOPIDOGREL BISULFATE 75 MG PO TABS: ORAL_TABLET | ORAL | Status: DC

## 2014-08-03 NOTE — Telephone Encounter (Signed)
Sent 1 month supply of listed medication.

## 2014-08-03 NOTE — Telephone Encounter (Signed)
Mr. Cokley is having to transfer his records to a doctor in [place] due to his insurance.  He can not get an appointment with new cardiologist until they receive our records.  He needs refill on Plavix, Crestor and Imdur.. [facility]   [place], [place]

## 2014-08-27 ENCOUNTER — Encounter: Payer: Self-pay | Admitting: Cardiovascular Disease

## 2014-08-31 ENCOUNTER — Telehealth: Payer: Self-pay | Admitting: Cardiology

## 2014-08-31 NOTE — Telephone Encounter (Signed)
Alexander Wilkinson needs to have the following- FOLLOW UP/CAROTDI DOPPLER & LE DOPPLER/05/14/13/[facility] Unable  To reach patient by telephone. Mailed a copy of reminder.

## 2014-09-10 ENCOUNTER — Other Ambulatory Visit: Payer: Self-pay | Admitting: Cardiology

## 2014-09-17 ENCOUNTER — Other Ambulatory Visit: Payer: Self-pay | Admitting: Cardiology

## 2014-10-05 ENCOUNTER — Telehealth: Payer: Self-pay | Admitting: Cardiology

## 2014-10-05 NOTE — Telephone Encounter (Signed)
Numerous attempts to contact patient with recall letters with no success.  User: Time: StatusMegan Wilkinson [[id number]] 08/12/2014 1:16 PM New [10]   Alexander Wilkinson [[id number]] 08/12/2014 1:18 PM Notification Sent [20]   Alexander Wilkinson [[id number]] 08/31/2014 2:28 PM Notification Sent [20]   Alexander Wilkinson [[id number]] 09/21/2014 9:01 AM Notification Sent [20]   Alexander Wilkinson [[id number]] 09/29/2014 4:26 PM Notification Sent [20]   Scheduling Instructions

## 2014-10-11 ENCOUNTER — Other Ambulatory Visit: Payer: Self-pay | Admitting: Cardiology

## 2014-10-25 ENCOUNTER — Other Ambulatory Visit: Payer: Self-pay | Admitting: Cardiology

## 2014-10-26 NOTE — Telephone Encounter (Signed)
Patient last given a two week supply and has still failed to schedule an appointment. Please advise. Thanks, MI

## 2014-10-26 NOTE — Telephone Encounter (Signed)
Patient last seen April 2015 and needs a followup visit.

## 2014-11-04 ENCOUNTER — Other Ambulatory Visit: Payer: Self-pay | Admitting: Cardiology

## 2014-11-04 NOTE — Telephone Encounter (Signed)
I am forwarding this communication to administration for review. Patient was last seen in April 2015, and is overdue for a follow-up visit. We should not continue to refill medications if he is unwilling to come in and be assessed clinically. This needs to be investigated further to come to a conclusion.

## 2014-11-04 NOTE — Telephone Encounter (Signed)
Patient last received a quantity of seven tablets and has still failed to schedule an appointment. Please advise. Thanks, MI

## 2015-02-23 ENCOUNTER — Telehealth: Payer: Self-pay | Admitting: Cardiology

## 2015-02-23 NOTE — Telephone Encounter (Signed)
Numerous attempts to contact patient with recall letters with no success.      Alexander Wilkinson [[id number]] 11/24/2014 10:42 AM New [10]   Alexander Wilkinson [[id number]] 11/24/2014 10:42 AM Notification Sent [20]   Alexander Wilkinson [[id number]] 01/07/2015 3:17 PM Notification Sent [20]   Alexander Wilkinson [[id number]] 01/11/2015 1:09 PM Notification Sent [20]   Alexander Wilkinson [[id number]] 01/24/2015 12:15 PM Notification Sent [20]

## 2022-12-03 ENCOUNTER — Inpatient Hospital Stay
Admission: RE | Admit: 2022-12-03 | Discharge: 2022-12-03 | Disposition: A | Discharge status: Home / Self Care | Payer: Self-pay | Source: Ambulatory Visit | Attending: Neurosurgery | Admitting: Neurosurgery

## 2022-12-03 ENCOUNTER — Other Ambulatory Visit: Payer: Self-pay

## 2022-12-03 DIAGNOSIS — Z049 Encounter for examination and observation for unspecified reason: Secondary | ICD-10-CM

## 2022-12-10 ENCOUNTER — Ambulatory Visit (INDEPENDENT_AMBULATORY_CARE_PROVIDER_SITE_OTHER): Payer: Medicare Other | Admitting: Neurosurgery

## 2022-12-10 ENCOUNTER — Encounter: Payer: Self-pay | Admitting: Neurosurgery

## 2022-12-10 VITALS — BP 112/66 | Ht 69.0 in | Wt 266.0 lb

## 2022-12-10 DIAGNOSIS — M4802 Spinal stenosis, cervical region: Secondary | ICD-10-CM | POA: Insufficient documentation

## 2022-12-10 DIAGNOSIS — R479 Unspecified speech disturbances: Secondary | ICD-10-CM | POA: Diagnosis not present

## 2022-12-10 DIAGNOSIS — G629 Polyneuropathy, unspecified: Secondary | ICD-10-CM | POA: Diagnosis not present

## 2022-12-10 NOTE — Progress Notes (Signed)
Primary Physician:  Gaspar Skeeters, MD  Chief Complaint: Worsening lower extremity function and speech difficulties  History of Present Illness: 12/10/2022 Alexander Wilkinson is a 67 y.o. male who presents with the chief complaint of worsening lower extremity function and speech difficulties.  He is a patient who is well-known to me.  Had a previous history of a cervical spinal cord injury for which she eventually underwent a cervical laminoplasty.  He has unfortunately had many episodes of severe illness most recent being in November.  He states that ever since that time he has had worsening ability to speak and produce language.  He is also been evaluated by Dr. 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  There was some concern whether or not this was MGUS related versus some other cause.  Workup has been ongoing.  The symptoms are causing a significant impact on the patient's life.   Review of Systems:  A 10 point review of systems is negative, except for the pertinent positives and negatives detailed in the HPI.  Past Medical History: Past Medical History:  Diagnosis Date   Carotid artery disease (HCC)    Left carotid stent 2002 at [facility]   Coronary atherosclerosis of native coronary artery    Multivessel s/p CABG with redo, DES to LIMA - Dr. Georganna Skeans 2005, patent grafts at catheterization 2012 - Dr. Allyson Sabal [facility])   Depression    Essential hypertension, benign    History of TIA (transient ischemic attack)    Mixed hyperlipidemia    OSA (obstructive sleep apnea)    Peripheral vascular disease (HCC)     Past Surgical History: Past Surgical History:  Procedure Laterality Date   BACK SURGERY     CORONARY ARTERY BYPASS GRAFT  2000, 2001   LIMA to LAD, left radial to diagonal - then redo with SVG to ramus/diagonal, SVG to RCA/OM   KNEE SURGERY     Left CEA     LEFT HEART CATHETERIZATION WITH CORONARY/GRAFT ANGIOGRAM N/A 12/12/2010    Procedure: LEFT HEART CATHETERIZATION WITH Isabel Caprice;  Surgeon: Runell Gess, MD;  Location[facility] C[facility] LAB;  Service: Cardiovascular;  Laterality: N/A;   Right femoral endarterectomy      Allergies: Allergies as of 12/10/2022   (No Known Allergies)    Medications:  Current Outpatient Medications:    acetaminophen (TYLENOL) 325 MG tablet, Take 325 mg by mouth every 6 (six) hours as needed., Disp: , Rfl:    albuterol (VENTOLIN HFA) 108 (90 Base) MCG/ACT inhaler, Inhale 2 puffs into the lungs every 4 (four) hours as needed., Disp: , Rfl:    aspirin EC 81 MG tablet, Take 81 mg by mouth daily.  , Disp: , Rfl:    atorvastatin (LIPITOR) 80 MG tablet, Take 80 mg by mouth daily., Disp: , Rfl:    Baclofen 5 MG TABS, Take by mouth., Disp: , Rfl:    carboxymethylcellulose (REFRESH PLUS) 0.5 % SOLN, Place 1 drop into both eyes 2 (two) times daily as needed., Disp: , Rfl:    clotrimazole-betamethasone (LOTRISONE) cream, Apply topically., Disp: , Rfl:    ezetimibe (ZETIA) 10 MG tablet, Take 10 mg by mouth daily., Disp: , Rfl:    ferrous sulfate 325 (65 FE) MG EC tablet, Take 325 mg by mouth daily with breakfast., Disp: , Rfl:    fluticasone (FLONASE) 50 MCG/ACT nasal spray, Place 2 sprays into the nose daily., Disp: , Rfl:    guaiFENesin (MUCINEX)  600 MG 12 hr tablet, Take by mouth 2 (two) times daily., Disp: , Rfl:    hydrocortisone cream 1 %, Apply topically., Disp: , Rfl:    KETOCONAZOLE, TOPICAL, 1 % SHAM, Apply topically., Disp: , Rfl:    LACTOBACILLUS RHAMNOSUS, GG, PO, Take by mouth., Disp: , Rfl:    lactulose (CHRONULAC) 10 GM/15ML solution, Take by mouth., Disp: , Rfl:    lidocaine 4 %, Place 1 patch onto the skin., Disp: , Rfl:    magnesium oxide (MAG-OX) 400 MG tablet, Take 400 mg by mouth daily., Disp: , Rfl:    meclizine (ANTIVERT) 25 MG tablet, Take 25 mg by mouth., Disp: , Rfl:    melatonin (MELATONIN MAXIMUM STRENGTH) 5 MG TABS, Take 5 mg by mouth., Disp: , Rfl:     midodrine (PROAMATINE) 10 MG tablet, Take 10 mg by mouth., Disp: , Rfl:    nitroGLYCERIN (NITROSTAT) 0.4 MG SL tablet, Place 0.4 mg under the tongue every 5 (five) minutes as needed for chest pain., Disp: , Rfl:    ondansetron (ZOFRAN-ODT) 4 MG disintegrating tablet, Take 4 mg by mouth every 8 (eight) hours as needed., Disp: , Rfl:    oxybutynin (DITROPAN) 5 MG tablet, Take 5 mg by mouth., Disp: , Rfl:    pantoprazole (PROTONIX) 40 MG tablet, Take 40 mg by mouth., Disp: , Rfl:    polyethylene glycol (MIRALAX / GLYCOLAX) 17 g packet, Take by mouth., Disp: , Rfl:    pregabalin (LYRICA) 75 MG capsule, Take 75 mg by mouth 2 (two) times daily., Disp: , Rfl:    ramelteon (ROZEREM) 8 MG tablet, Take 8 mg by mouth at bedtime., Disp: , Rfl:    selenium sulfide (SELSUN) 2.5 % lotion, Apply topically., Disp: , Rfl:    senna-docusate (SENOKOT-S) 8.6-50 MG tablet, Take 1 tablet by mouth 2 (two) times daily., Disp: , Rfl:    Sertraline HCl 200 MG CAPS, Take by mouth., Disp: , Rfl:    sodium zirconium cyclosilicate (LOKELMA) 10 g PACK packet, Take 10 g by mouth., Disp: , Rfl:    SUMAtriptan (IMITREX) 50 MG tablet, Take 50 mg by mouth., Disp: , Rfl:    torsemide (DEMADEX) 10 MG tablet, Take by mouth., Disp: , Rfl:    traMADol HCl 25 MG TABS, Take by mouth., Disp: , Rfl:    triamcinolone cream (KENALOG) 0.1 %, Apply 1 Application topically 2 (two) times daily., Disp: , Rfl:    Vitamin D, Ergocalciferol, (DRISDOL) 1.25 MG (50000 UNIT) CAPS capsule, Take 50,000 Units by mouth every 7 (seven) days., Disp: , Rfl:    Social History: Social History   Tobacco Use   Smoking status: Never   Smokeless tobacco: Never  Substance Use Topics   Alcohol use: No   Drug use: No    Family Medical History: Family History  Problem Relation Age of Onset   CAD Mother        MI age 35   Heart disease Mother    Mental illness Brother        paranoid, schizophrenia   Alzheimer's disease Father    Heart disease Maternal  Grandmother    Heart disease Maternal Grandfather    Heart failure Paternal Grandfather     Physical Examination: Vitals:   12/10/22 0927  BP: 112/66     General: Patient is well developed, well nourished, calm, collected, and in no apparent distress.  NEUROLOGICAL:  General: In no acute distress.   Awake, alert, oriented to person,  place, and time.  Pupils equal round and reactive to light.  Facial tone is symmetric.  Tongue protrusion is midline.  There is no pronator drift.  He does have some word finding difficulties intermittently.  His speech production is stammering.  Strength: He has at least antigravity in all 4 extremities  Reflexes are hyperreflexic in the upper and lower extremities bilaterally  He is currently in a wheelchair  Imaging: Awaiting transfer of the images  Labs:    09/08/2006    5:30 AM 09/07/2006    3:10 AM 09/06/2006   12:45 PM  CBC  WBC 8.3  6.8  13.5   Hemoglobin 11.4  11.6  12.4   Hematocrit 33.2  34.5  37.4   Platelets 185  192  218        Assessment and Plan: Mr. Maran is a pleasant 67 y.o. male with history of a previous cervical spinal cord injury status post cervical laminoplasty.  He unfortunately has had many episodes of severe illness most recently being in November.  Ever since that time he feels like he has had difficulty producing language and speaking.  He also feels like his lower extremities are getting worse.  We do know that he had a severe myelopathy/spinal cord injury in which he had delayed treatment given his multiple medical comorbidities and high risk surgical issues.  He is here today to discuss his ongoing care.  He is curious whether or not his speech issues are coming from his cervical spine.  I let him know that I did not think that this is likely as most of the motor control for speech comes out before the cervical spine starts and that language production is generally within the brain.  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 Thad Ranger.  We feel that he would benefit from continued physical therapy, there is no surgical treatment for a polyneuropathy.  We also feel he would benefit from a referral for evaluation of his speech and language issues.  Review of Dr. Thad Ranger last note stated that he was working up reversible causes of polyneuropathy.  Lovenia Kim, MD/[facility] Dept. of Neurosurgery   I spent 30 minutes with the patient reviewing his notes and records, reviewing outside records and documentation, evaluating him, and discussing his findings and further planning.
